# Patient Record
Sex: Male | Born: 1970
Health system: Southern US, Community
[De-identification: ages and names within clinical notes are randomized; demographics above are authoritative.]

## PROBLEM LIST (undated history)

## (undated) DIAGNOSIS — K219 Gastro-esophageal reflux disease without esophagitis: Secondary | ICD-10-CM

## (undated) DIAGNOSIS — K589 Irritable bowel syndrome without diarrhea: Secondary | ICD-10-CM

## (undated) DIAGNOSIS — M25579 Pain in unspecified ankle and joints of unspecified foot: Secondary | ICD-10-CM

## (undated) DIAGNOSIS — G4700 Insomnia, unspecified: Secondary | ICD-10-CM

## (undated) DIAGNOSIS — F319 Bipolar disorder, unspecified: Secondary | ICD-10-CM

## (undated) DIAGNOSIS — M549 Dorsalgia, unspecified: Secondary | ICD-10-CM

## (undated) DIAGNOSIS — F909 Attention-deficit hyperactivity disorder, unspecified type: Secondary | ICD-10-CM

## (undated) DIAGNOSIS — K224 Dyskinesia of esophagus: Secondary | ICD-10-CM

## (undated) DIAGNOSIS — E039 Hypothyroidism, unspecified: Secondary | ICD-10-CM

## (undated) DIAGNOSIS — E7800 Pure hypercholesterolemia, unspecified: Secondary | ICD-10-CM

## (undated) HISTORY — PX: INGUINAL HERNIA REPAIR: SUR1180

## (undated) HISTORY — DX: Attention-deficit hyperactivity disorder, unspecified type: F90.9

## (undated) HISTORY — DX: Insomnia, unspecified: G47.00

## (undated) HISTORY — DX: Pain in unspecified ankle and joints of unspecified foot: M25.579

## (undated) HISTORY — PX: APPENDECTOMY: SHX54

## (undated) HISTORY — DX: Irritable bowel syndrome, unspecified: K58.9

## (undated) HISTORY — PX: VASECTOMY: SHX75

## (undated) HISTORY — DX: Dyskinesia of esophagus: K22.4

## (undated) HISTORY — DX: Dorsalgia, unspecified: M54.9

---

## 2013-02-10 ENCOUNTER — Encounter (HOSPITAL_COMMUNITY): Payer: Self-pay | Admitting: *Deleted

## 2013-02-10 ENCOUNTER — Emergency Department (HOSPITAL_COMMUNITY)
Admission: EM | Admit: 2013-02-10 | Discharge: 2013-02-10 | Disposition: A | Discharge status: Home / Self Care | Payer: BC Managed Care – PPO | Attending: Emergency Medicine | Admitting: Emergency Medicine

## 2013-02-10 DIAGNOSIS — E7800 Pure hypercholesterolemia, unspecified: Secondary | ICD-10-CM | POA: Insufficient documentation

## 2013-02-10 DIAGNOSIS — E039 Hypothyroidism, unspecified: Secondary | ICD-10-CM | POA: Insufficient documentation

## 2013-02-10 DIAGNOSIS — R404 Transient alteration of awareness: Secondary | ICD-10-CM | POA: Insufficient documentation

## 2013-02-10 DIAGNOSIS — R4789 Other speech disturbances: Secondary | ICD-10-CM | POA: Insufficient documentation

## 2013-02-10 DIAGNOSIS — K219 Gastro-esophageal reflux disease without esophagitis: Secondary | ICD-10-CM | POA: Insufficient documentation

## 2013-02-10 DIAGNOSIS — T424X5A Adverse effect of benzodiazepines, initial encounter: Secondary | ICD-10-CM | POA: Insufficient documentation

## 2013-02-10 DIAGNOSIS — F319 Bipolar disorder, unspecified: Secondary | ICD-10-CM | POA: Insufficient documentation

## 2013-02-10 DIAGNOSIS — R471 Dysarthria and anarthria: Secondary | ICD-10-CM | POA: Insufficient documentation

## 2013-02-10 DIAGNOSIS — T43505A Adverse effect of unspecified antipsychotics and neuroleptics, initial encounter: Secondary | ICD-10-CM | POA: Insufficient documentation

## 2013-02-10 DIAGNOSIS — T50905A Adverse effect of unspecified drugs, medicaments and biological substances, initial encounter: Secondary | ICD-10-CM

## 2013-02-10 DIAGNOSIS — Z79899 Other long term (current) drug therapy: Secondary | ICD-10-CM | POA: Insufficient documentation

## 2013-02-10 HISTORY — DX: Gastro-esophageal reflux disease without esophagitis: K21.9

## 2013-02-10 HISTORY — DX: Pure hypercholesterolemia, unspecified: E78.00

## 2013-02-10 HISTORY — DX: Bipolar disorder, unspecified: F31.9

## 2013-02-10 HISTORY — DX: Hypothyroidism, unspecified: E03.9

## 2013-02-10 NOTE — ED Notes (Addendum)
Per EMS - pt c/o confusion and slurred speech.  Pt is a pharmacist at [employer].  Pt states he took Navane 2 mg and 2 mg of Clonazepam at 8 am.  Pt also took Depakote 75 mg and Prilosec at 8 am.  Pt states these are his daily meds and he adjusts them as necessary.  Pt states he didn't get enough sleep last night and when he woke up his speech was slurred.  Pt states this is typical of his speech when he is suffering from insomnia or can't sleep because of mania, which he experienced last night.  Pt also reports flight of ideas.  Pt denies unilateral weakness.  Grips = bilat, smile symmetrical, no arm drift.  Strength = in both upper extremities.  Pt's speech is not slurred in triage, but does sound forced.

## 2013-02-10 NOTE — ED Provider Notes (Signed)
History     CSN: [id number]  Arrival date & time 02/10/13  1223   First MD Initiated Contact with Patient 02/10/13 1302      Chief Complaint  Patient presents with  . slurred speech     took too much of Navane and 2 extra clonazepam    (Consider location/radiation/quality/duration/timing/severity/associated sxs/prior treatment) HPI Comments: Brian Cunningham is a 42 y.o. Male who presents for evaluation of slurred speech. He, states that at 3 AM this morning. He took 1 mg of clonazepam and 2 mg of Navane. He did this to get to sleep. He does not usually take Navane, but uses it, when needed for manic thoughts. His wife had to awaken him for work this morning. While driving to work, he got pulled over and get a ticket for drowsy driving. Later at work, his coworkers, who are pharmacists, told him to go home. At this time, he is improved, but his wife states he is still drowsy. The patient does not feel that his manic thoughts or behaviors are active, now. He sees psychiatric therapist and psychiatric provider, regularly. He has chronic relapsing insomnia, episodes. There are no other known modifying factors.  The history is provided by the patient.    Past Medical History  Diagnosis Date  . Hypothyroidism   . GERD (gastroesophageal reflux disease)   . High cholesterol   . Bipolar disorder     History reviewed. No pertinent past surgical history.  No family history on file.  History  Substance Use Topics  . Smoking status: Not on file  . Smokeless tobacco: Not on file  . Alcohol Use: Not on file      Review of Systems  All other systems reviewed and are negative.    Allergies  Review of patient's allergies indicates no known allergies.  Home Medications   Current Outpatient Rx  Name  Route  Sig  Dispense  Refill  . Cholecalciferol (VITAMIN D) 2000 UNITS tablet   Oral   Take 2,000 Units by mouth daily.         . citalopram (CELEXA) 40 MG tablet   Oral  Take 40 mg by mouth daily as needed.         . clonazePAM (KLONOPIN) 0.5 MG tablet   Oral   Take 0.5 mg by mouth at bedtime as needed (for sleep).         . divalproex (DEPAKOTE ER) 500 MG 24 hr tablet   Oral   Take 1,500 mg by mouth 2 (two) times daily.         Marland Kitchen levothyroxine (SYNTHROID, LEVOTHROID) 75 MCG tablet   Oral   Take 75 mcg by mouth daily before breakfast.         . lurasidone (LATUDA) 40 MG TABS   Oral   Take 40 mg by mouth every evening.         . modafinil (PROVIGIL) 200 MG tablet   Oral   Take 200 mg by mouth daily.         . Multiple Vitamin (MULTIVITAMIN WITH MINERALS) TABS   Oral   Take 1 tablet by mouth daily.         . Omega-3 Fatty Acids (FISH OIL) 1200 MG CPDR   Oral   Take 1,200 mg by mouth.         Marland Kitchen omeprazole (PRILOSEC) 40 MG capsule   Oral   Take 40 mg by mouth daily.         Marland Kitchen  oxybutynin (DITROPAN-XL) 10 MG 24 hr tablet   Oral   Take 10 mg by mouth daily.         . pravastatin (PRAVACHOL) 80 MG tablet   Oral   Take 80 mg by mouth daily.         . Pseudoephedrine HCl (SUDAFED 24 HOUR NON-DROWSY) 240 MG TB24   Oral   Take 240 mg by mouth daily as needed (congestion).         . topiramate (TOPAMAX) 100 MG tablet   Oral   Take 100 mg by mouth 2 (two) times daily.         . traZODone (DESYREL) 100 MG tablet   Oral   Take 100 mg by mouth at bedtime as needed for sleep.         Marland Kitchen venlafaxine (EFFEXOR) 75 MG tablet   Oral   Take 75 mg by mouth every morning.         . ziprasidone (GEODON) 40 MG capsule   Oral   Take 40 mg by mouth 2 (two) times daily with a meal.           BP 107/72  Pulse 73  Temp(Src) 98.3 F (36.8 C) (Oral)  Resp 16  SpO2 97%  Physical Exam  Nursing note and vitals reviewed. Constitutional: He is oriented to person, place, and time. He appears well-developed and well-nourished.  HENT:  Head: Normocephalic and atraumatic.  Right Ear: External ear normal.  Left Ear:  External ear normal.  Eyes: Conjunctivae and EOM are normal. Pupils are equal, round, and reactive to light.  Neck: Normal range of motion and phonation normal. Neck supple.  Cardiovascular: Normal rate, regular rhythm, normal heart sounds and intact distal pulses.   Pulmonary/Chest: Effort normal and breath sounds normal. He exhibits no bony tenderness.  Abdominal: Soft. Normal appearance. There is no tenderness.  Musculoskeletal: Normal range of motion.  Neurological: He is alert and oriented to person, place, and time. He has normal strength. No cranial nerve deficit or sensory deficit. He exhibits normal muscle tone. Coordination normal.  Very mild dysarthria. At times he falls asleep when not being stimulated, by speech.  Skin: Skin is warm, dry and intact.  Psychiatric: He has a normal mood and affect. His behavior is normal. Judgment and thought content normal.    ED Course  Procedures (including critical care time)      1. Medication adverse effect, initial encounter       MDM  Drowsiness related to polypharmacy and unaccustomed to using Navane. Doubt CVA, additional medication overdoses, metabolic instability, or occult infection. He stable for discharge with outpatient followup with his psychiatric provider.  Nursing Notes Reviewed/ Care Coordinated, and agree without changes. Applicable Imaging Reviewed.  Interpretation of Laboratory Data incorporated into ED treatment    Plan: Home Medications- usual; Home Treatments- he is advised to not work or drive within one day of taking a dose of Navane. He was offered psychiatric assessment in the ED, and declined it.; Recommended follow up- he is to followup with his psychiatric provider as soon as possible         Flint Melter, MD 02/10/13 1329

## 2013-02-23 ENCOUNTER — Emergency Department (HOSPITAL_COMMUNITY): Payer: BC Managed Care – PPO

## 2013-02-23 ENCOUNTER — Emergency Department (HOSPITAL_COMMUNITY)
Admission: EM | Admit: 2013-02-23 | Discharge: 2013-02-23 | Disposition: A | Discharge status: Home / Self Care | Payer: BC Managed Care – PPO | Attending: Emergency Medicine | Admitting: Emergency Medicine

## 2013-02-23 ENCOUNTER — Encounter (HOSPITAL_COMMUNITY): Payer: Self-pay | Admitting: Emergency Medicine

## 2013-02-23 DIAGNOSIS — F319 Bipolar disorder, unspecified: Secondary | ICD-10-CM | POA: Insufficient documentation

## 2013-02-23 DIAGNOSIS — Z79899 Other long term (current) drug therapy: Secondary | ICD-10-CM | POA: Insufficient documentation

## 2013-02-23 DIAGNOSIS — E7800 Pure hypercholesterolemia, unspecified: Secondary | ICD-10-CM | POA: Insufficient documentation

## 2013-02-23 DIAGNOSIS — K219 Gastro-esophageal reflux disease without esophagitis: Secondary | ICD-10-CM | POA: Insufficient documentation

## 2013-02-23 DIAGNOSIS — E039 Hypothyroidism, unspecified: Secondary | ICD-10-CM | POA: Insufficient documentation

## 2013-02-23 DIAGNOSIS — R509 Fever, unspecified: Secondary | ICD-10-CM | POA: Insufficient documentation

## 2013-02-23 DIAGNOSIS — R5381 Other malaise: Secondary | ICD-10-CM | POA: Insufficient documentation

## 2013-02-23 DIAGNOSIS — R5383 Other fatigue: Secondary | ICD-10-CM | POA: Insufficient documentation

## 2013-02-23 DIAGNOSIS — R0602 Shortness of breath: Secondary | ICD-10-CM | POA: Insufficient documentation

## 2013-02-23 NOTE — ED Notes (Signed)
Pt states that on Thursday he started felling bad. Then yesterday pt started having fever, shakiness of hands, weakness/fatigue.  Pt states that he went to [facility] and O2 sat was low and was instructed to come to ED for further eval.

## 2013-02-23 NOTE — ED Provider Notes (Signed)
History  This chart was scribed for non-physician practitioner working with Brian Cunningham. Oletta Lamas, MD by Greggory Stallion, ED scribe. This patient was seen in room WTR5/WTR5 and the patient's care was started at 3:08 PM.  CSN: [id number] Arrival date & time 02/23/13  1404   Chief Complaint  Patient presents with  . Fever  . Shortness of Breath  . Fatigue    Patient is a 42 y.o. male presenting with fever. The history is provided by the patient. No language interpreter was used.  Fever Max temp prior to arrival:  101.4 Severity:  Mild Onset quality:  Gradual Duration:  2 days Timing:  Intermittent Chronicity:  New Relieved by:  Ibuprofen Associated symptoms: chills     HPI Comments: Stavros Cail is a 42 y.o. male who presents to the Emergency Department complaining of gradual onset, intermittent fever with associated SOB, chills and fatigue that started 2 days ago. He states his fever was 101.4 yesterday. Pt states two nights ago his hands were shaking uncontrollably. He states that his wife told him his lips were blue. Pt states he went to [facility] and his O2 sat was low, 86%, and was instructed to come to the ED for further evaluation. He states he has taken 1000 mg ibuprofen with some relief. Pt states he feels like all symptoms were relieved for 8 hours by the ibuprofen and then came back. He states he has never had this problem before. He states he has HA due to the fever. Pt states for the past years he has a sensation in his throat like he needs to cough something up but can't. Pt denies CP, abdominal pain, ear pain, nausea, emesis, diarrhea, and numbness as associated symptoms. Pt states he has been taken his medication regularly as prescribed and has not had any new medication. He states his appetite has remained normal.  Past Medical History  Diagnosis Date  . Hypothyroidism   . GERD (gastroesophageal reflux disease)   . High cholesterol   . Bipolar disorder    Past  Surgical History  Procedure Laterality Date  . Appendectomy     No family history on file. History  Substance Use Topics  . Smoking status: Never Smoker   . Smokeless tobacco: Not on file  . Alcohol Use: Yes    Review of Systems  Constitutional: Positive for fever and chills.  All other systems reviewed and are negative.    Allergies  Review of patient's allergies indicates no known allergies.  Home Medications   Current Outpatient Rx  Name  Route  Sig  Dispense  Refill  . Cholecalciferol (VITAMIN D) 2000 UNITS tablet   Oral   Take 2,000 Units by mouth daily.         . clonazePAM (KLONOPIN) 0.5 MG tablet   Oral   Take 0.5 mg by mouth at bedtime as needed (for sleep).         . divalproex (DEPAKOTE ER) 500 MG 24 hr tablet   Oral   Take 1,500 mg by mouth 2 (two) times daily.         Marland Kitchen EPINEPHrine (EPI-PEN) 0.3 mg/0.3 mL SOAJ   Intramuscular   Inject 0.3 mg into the muscle once.         Marland Kitchen levothyroxine (SYNTHROID, LEVOTHROID) 75 MCG tablet   Oral   Take 75 mcg by mouth daily before breakfast.         . lurasidone (LATUDA) 40 MG TABS   Oral  Take 40 mg by mouth every evening.         . modafinil (PROVIGIL) 200 MG tablet   Oral   Take 200 mg by mouth daily.         . Multiple Vitamin (MULTIVITAMIN WITH MINERALS) TABS   Oral   Take 1 tablet by mouth daily.         . Omega-3 Fatty Acids (FISH OIL) 1200 MG CPDR   Oral   Take 1,200 mg by mouth.         Marland Kitchen omeprazole (PRILOSEC) 40 MG capsule   Oral   Take 40 mg by mouth daily.         Marland Kitchen oxybutynin (DITROPAN-XL) 10 MG 24 hr tablet   Oral   Take 10 mg by mouth daily.         . pravastatin (PRAVACHOL) 20 MG tablet   Oral   Take 20 mg by mouth daily.         . Pseudoephedrine HCl (SUDAFED 24 HOUR NON-DROWSY) 240 MG TB24   Oral   Take 240 mg by mouth daily as needed (congestion).         . topiramate (TOPAMAX) 100 MG tablet   Oral   Take 100 mg by mouth at bedtime.           . traZODone (DESYREL) 100 MG tablet   Oral   Take 100 mg by mouth at bedtime as needed for sleep.         Marland Kitchen venlafaxine XR (EFFEXOR-XR) 75 MG 24 hr capsule   Oral   Take 75 mg by mouth daily.          BP 114/75  Temp(Src) 97.7 F (36.5 C) (Oral)  Resp 17  SpO2 99%  Physical Exam  Nursing note and vitals reviewed. Constitutional: He is oriented to person, place, and time. He appears well-developed and well-nourished. No distress.  HENT:  Head: Normocephalic and atraumatic.  Eyes: EOM are normal.  Neck: Normal range of motion. Neck supple. No tracheal deviation present.  Cardiovascular: Normal rate, regular rhythm and normal heart sounds.   Normal cap refill left hand. 3+ cap refill on right hand. Radial pulse normal.   Pulmonary/Chest: Effort normal and breath sounds normal. No respiratory distress.  Musculoskeletal: Normal range of motion. He exhibits no edema.  5/5 strength to all 4 extremities.  Neurological: He is alert and oriented to person, place, and time.  Skin: Skin is warm and dry.  Psychiatric: He has a normal mood and affect. His behavior is normal.    ED Course  Procedures (including critical care time)  DIAGNOSTIC STUDIES: Oxygen Saturation is 99% on RA, normal by my interpretation.    COORDINATION OF CARE: 3:30 PM-Discussed treatment plan which includes chest xray with pt at bedside and pt agreed to plan.  3:50 PM Pt with stable normal vital sign, afebrile, in no acute resp distress, nontoxic in appearance.  CXR shows no acute finding.   R hand with questionable [place] as tip of fingers with mildly delay cap refill however normal radial pulses.  I recommend pt to have close f/u with PCP for further evaluation.  Otherwise pt stable for discharge. Pt agrees with plan and agrees to return if sxs worsen. Ambulate while maintaining normal O2.  Labs Reviewed - No data to display Dg Chest 2 View  02/23/2013   *RADIOLOGY REPORT*  Clinical Data: Central  chest pain.  History of hypertension.  CHEST - 2 VIEW  Comparison: None.  Findings: The heart size and mediastinal contours are normal. The lungs are clear. There is no pleural effusion or pneumothorax. No acute osseous findings are identified.  IMPRESSION: No active cardiopulmonary process.   Original Report Authenticated By: Carey Bullocks, M.D.   1. SOB (shortness of breath)     MDM  BP 114/75  Temp(Src) 97.7 F (36.5 C) (Oral)  Resp 17  SpO2 99%   I have reviewed nursing notes and vital signs. I personally reviewed the imaging tests through PACS system  I reviewed available ER/hospitalization records thought the EMR    I personally performed the services described in this documentation, which was scribed in my presence. The recorded information has been reviewed and is accurate.    Fayrene Helper, PA-C 02/23/13 1557

## 2013-02-24 NOTE — ED Provider Notes (Signed)
Medical screening examination/treatment/procedure(s) were performed by non-physician practitioner and as supervising physician I was immediately available for consultation/collaboration.   Bronda Alfred Y. Olivette Beckmann, MD 02/24/13 0002 

## 2015-04-29 ENCOUNTER — Other Ambulatory Visit: Payer: Self-pay | Admitting: Physician Assistant

## 2015-04-29 ENCOUNTER — Other Ambulatory Visit: Payer: Self-pay

## 2015-04-29 DIAGNOSIS — R079 Chest pain, unspecified: Secondary | ICD-10-CM

## 2015-05-05 ENCOUNTER — Ambulatory Visit
Admission: RE | Admit: 2015-05-05 | Discharge: 2015-05-05 | Disposition: A | Discharge status: Home / Self Care | Payer: BLUE CROSS/BLUE SHIELD | Source: Ambulatory Visit | Attending: Physician Assistant | Admitting: Physician Assistant

## 2015-05-05 DIAGNOSIS — R079 Chest pain, unspecified: Secondary | ICD-10-CM

## 2015-06-24 ENCOUNTER — Other Ambulatory Visit: Payer: Self-pay | Admitting: Physician Assistant

## 2015-06-24 DIAGNOSIS — R933 Abnormal findings on diagnostic imaging of other parts of digestive tract: Secondary | ICD-10-CM

## 2015-06-30 ENCOUNTER — Other Ambulatory Visit: Payer: BLUE CROSS/BLUE SHIELD

## 2015-09-02 ENCOUNTER — Inpatient Hospital Stay: Admission: RE | Admit: 2015-09-02 | Payer: BLUE CROSS/BLUE SHIELD | Source: Ambulatory Visit

## 2015-10-21 ENCOUNTER — Ambulatory Visit
Admission: RE | Admit: 2015-10-21 | Discharge: 2015-10-21 | Disposition: A | Discharge status: Home / Self Care | Payer: Medicaid Other | Source: Ambulatory Visit | Attending: Physician Assistant | Admitting: Physician Assistant

## 2015-10-21 DIAGNOSIS — R933 Abnormal findings on diagnostic imaging of other parts of digestive tract: Secondary | ICD-10-CM

## 2015-10-21 MED ORDER — IOPAMIDOL (ISOVUE-300) INJECTION 61%
75.0000 mL | Freq: Once | INTRAVENOUS | Status: AC | PRN
  Administered 2015-10-21: 75 mL via INTRAVENOUS

## 2015-10-24 NOTE — Progress Notes (Signed)
Patient ID: Brian Cunningham, male   DOB: 1970/12/31, 45 y.o.   MRN: [id number]     Cardiology Office Note   Date:  10/26/2015   ID:  Brian Cunningham, DOB [birth year], MRN [id number]  PCP:  Brian Cunningham  Cardiologist:   Brian Rouge, MD   Chief Complaint  Patient presents with  . Establish Care    Atypical CP      History of Present Illness: Brian Cunningham is a 45 y.o. male who presents for evaluation of chest pain. Noted CT chest normal  10/21/15  Done for abnormal barium swallow.  Saw [place] PA mark Hepler  10/19/15 complained about non exertional chest pain.  Normal barrium swallow and endoscopy.  Persists despite omeprazole for reflux.  Given SL nitro to try  Did help pain once He has had this pain for years.  Recently left stressful job with [employer] and pain better Also saw chiropractor for back pain and chest pain better  Pain can be worse at night in recumbencey  Sometimes can't get comfortable   No family history of premature CAD  Uses the elliptical for 30 minutes most mornings with no pain .      Past Medical History  Diagnosis Date  . Hypothyroidism   . GERD (gastroesophageal reflux disease)   . High cholesterol   . Bipolar disorder [facility])     Past Surgical History  Procedure Laterality Date  . Appendectomy       Current Outpatient Prescriptions  Medication Sig Dispense Refill  . aspirin 162 MG EC tablet Take 162 mg by mouth daily.    . Cholecalciferol (VITAMIN D3) 5000 units TABS Take 1 tablet by mouth daily.    . divalproex (DEPAKOTE ER) 500 MG 24 hr tablet Take 500 mg by mouth 2 (two) times daily.     Marland Kitchen levothyroxine (SYNTHROID, LEVOTHROID) 75 MCG tablet Take 75 mcg by mouth daily before breakfast.    . lurasidone (LATUDA) 40 MG TABS Take 40 mg by mouth every evening.    . Multiple Vitamin (MULTIVITAMIN WITH MINERALS) TABS Take 1 tablet by mouth daily.    . nitroGLYCERIN (NITROSTAT) 0.3 MG SL tablet Place 0.3 mg under the tongue every 5  (five) minutes as needed for chest pain (3 doses max).    . Omega-3 Fatty Acids (FISH OIL) 1200 MG CPDR Take 1,200 mg by mouth.    Marland Kitchen omeprazole (PRILOSEC) 40 MG capsule Take 40 mg by mouth 2 (two) times daily.     Marland Kitchen venlafaxine XR (EFFEXOR-XR) 75 MG 24 hr capsule Take 75 mg by mouth daily.     No current facility-administered medications for this visit.    Allergies:   Review of patient's allergies indicates no known allergies.    Social History:  The patient  reports that he has never smoked. He does not have any smokeless tobacco history on file. He reports that he drinks alcohol.   Family History:  The patient's family history includes Hyperlipidemia in his father.    ROS:  Please see the history of present illness.   Otherwise, review of systems are positive for none.   All other systems are reviewed and negative.    PHYSICAL EXAM: VS:  BP 122/80 mmHg  Pulse 88  Ht 6\' 3"  (1.905 m)  Wt 116.302 kg (256 lb 6.4 oz)  BMI 32.05 kg/m2  SpO2 98% , BMI Body mass index is 32.05 kg/(m^2). Affect appropriate Healthy:  appears stated age 56: normal Neck supple with no  adenopathy JVP normal no bruits no thyromegaly Lungs clear with no wheezing and good diaphragmatic motion Heart:  S1/S2 no murmur, no rub, gallop or click PMI normal Abdomen: benighn, BS positve, no tenderness, no AAA no bruit.  No HSM or HJR Distal pulses intact with no bruits No edema Neuro non-focal Skin warm and dry No muscular weakness    EKG:   02/23/13  SR rate 93 low voltage  10/26/15  SR rate 84 normal    Recent Labs: No results found for requested labs within last 365 days.    Lipid Panel No results found for: CHOL, TRIG, HDL, CHOLHDL, VLDL, LDLCALC, LDLDIRECT    Wt Readings from Last 3 Encounters:  10/26/15 116.302 kg (256 lb 6.4 oz)      Other studies Reviewed: Additional studies/ records that were reviewed today include: [facility] care notes see HPI.    ASSESSMENT AND PLAN:  1.   Chest pain normal ECG atypical f/u ETT Also recommended coronary calcium score for 5 year risk Suspect pain is related to stress 2. Thyroid on replacement TSH normal 3. Depression continue effexor   4. GERD:  Recent EGD and barrium swallow normal on prilosec   Current medicines are reviewed at length with the patient today.  The patient does not have concerns regarding medicines.  The following changes have been made:  no change  Labs/ tests ordered today include: Calcium score and ETT  No orders of the defined types were placed in this encounter.     Disposition:   FU with me PRN     Signed, Brian Rouge, MD  10/26/2015 11:59 AM    [facility] [place], [place], [place]  [address] Phone: [phone]; Fax: [phone]

## 2015-10-26 ENCOUNTER — Encounter: Payer: Self-pay | Admitting: Cardiovascular Disease

## 2015-10-26 ENCOUNTER — Ambulatory Visit (INDEPENDENT_AMBULATORY_CARE_PROVIDER_SITE_OTHER): Payer: Medicaid Other | Admitting: Cardiovascular Disease

## 2015-10-26 VITALS — BP 122/80 | HR 88 | Ht 75.0 in | Wt 256.4 lb

## 2015-10-26 DIAGNOSIS — Z7189 Other specified counseling: Secondary | ICD-10-CM | POA: Diagnosis not present

## 2015-10-26 DIAGNOSIS — Z7689 Persons encountering health services in other specified circumstances: Secondary | ICD-10-CM

## 2015-10-26 DIAGNOSIS — R079 Chest pain, unspecified: Secondary | ICD-10-CM

## 2015-10-26 NOTE — Patient Instructions (Signed)
Medication Instructions:  Your physician recommends that you continue on your current medications as directed. Please refer to the Current Medication list given to you today.  Labwork: NONE  Testing/Procedures: Cardiac CT Calcium score scanning, (CAT scanning), is a noninvasive, special x-ray that produces cross-sectional images of the body using x-rays and a computer. CT scans help physicians diagnose and treat medical conditions. For some CT exams, a contrast material is used to enhance visibility in the area of the body being studied. CT scans provide greater clarity and reveal more details than regular x-ray exams.  Your physician has requested that you have an exercise tolerance test. For further information please visit [email or web address]. Please also follow instruction sheet, as given.  Follow-Up: Your physician wants you to follow-up as needed with Dr. Johnsie Cancel.   If you need a refill on your cardiac medications before your next appointment, please call your pharmacy.

## 2015-11-05 ENCOUNTER — Ambulatory Visit (INDEPENDENT_AMBULATORY_CARE_PROVIDER_SITE_OTHER)
Admission: RE | Admit: 2015-11-05 | Discharge: 2015-11-05 | Disposition: A | Discharge status: Home / Self Care | Payer: Self-pay | Source: Ambulatory Visit | Attending: Cardiovascular Disease | Admitting: Cardiovascular Disease

## 2015-11-05 ENCOUNTER — Ambulatory Visit (INDEPENDENT_AMBULATORY_CARE_PROVIDER_SITE_OTHER): Payer: Medicaid Other

## 2015-11-05 DIAGNOSIS — R079 Chest pain, unspecified: Secondary | ICD-10-CM | POA: Diagnosis not present

## 2015-11-05 LAB — EXERCISE TOLERANCE TEST
CHL CUP MPHR: 176 {beats}/min
CHL CUP STRESS STAGE 1 DBP: 89 mmHg
CHL CUP STRESS STAGE 1 GRADE: 0 %
CHL CUP STRESS STAGE 1 HR: 86 {beats}/min
CHL CUP STRESS STAGE 1 SBP: 143 mmHg
CHL CUP STRESS STAGE 1 SPEED: 0 mph
CHL CUP STRESS STAGE 2 GRADE: 0 %
CHL CUP STRESS STAGE 2 HR: 87 {beats}/min
CHL CUP STRESS STAGE 3 GRADE: 0 %
CHL CUP STRESS STAGE 4 GRADE: 10 %
CHL CUP STRESS STAGE 4 HR: 100 {beats}/min
CHL CUP STRESS STAGE 4 SBP: 186 mmHg
CHL CUP STRESS STAGE 4 SPEED: 1.7 mph
CHL CUP STRESS STAGE 5 DBP: 81 mmHg
CHL CUP STRESS STAGE 5 GRADE: 12 %
CHL CUP STRESS STAGE 5 HR: 115 {beats}/min
CHL CUP STRESS STAGE 5 SBP: 166 mmHg
CHL CUP STRESS STAGE 5 SPEED: 2.5 mph
CHL CUP STRESS STAGE 6 GRADE: 14 %
CHL CUP STRESS STAGE 6 SPEED: 3.4 mph
CHL CUP STRESS STAGE 7 GRADE: 16 %
CHL CUP STRESS STAGE 7 HR: 139 {beats}/min
CHL CUP STRESS STAGE 8 HR: 130 {beats}/min
CHL CUP STRESS STAGE 8 SBP: 182 mmHg
CHL CUP STRESS STAGE 8 SPEED: 1.5 mph
CHL CUP STRESS STAGE 9 SBP: 154 mmHg
CSEPEW: 11.5 METS
CSEPHR: 78 %
CSEPPHR: 139 {beats}/min
CSEPPMHR: 78 %
Exercise duration (min): 9 min
Exercise duration (sec): 52 s
RPE: 17
Rest HR: 81 {beats}/min
Stage 2 Speed: 1 mph
Stage 3 HR: 86 {beats}/min
Stage 3 Speed: 1 mph
Stage 4 DBP: 86 mmHg
Stage 6 DBP: 88 mmHg
Stage 6 HR: 133 {beats}/min
Stage 6 SBP: 194 mmHg
Stage 7 Speed: 4.2 mph
Stage 8 DBP: 85 mmHg
Stage 8 Grade: 0 %
Stage 9 DBP: 92 mmHg
Stage 9 Grade: 0 %
Stage 9 HR: 99 {beats}/min
Stage 9 Speed: 0 mph

## 2015-11-12 ENCOUNTER — Telehealth: Payer: Self-pay | Admitting: Cardiovascular Disease

## 2015-11-12 NOTE — Telephone Encounter (Signed)
Reviewed results of cardiac CT score and GXT with patient who verbalized understanding and agreement to follow-up as needed.

## 2015-11-12 NOTE — Telephone Encounter (Signed)
New message ° ° ° ° ° °Returning a nurses call to get test results °

## 2017-12-11 MED FILL — LEVOTHYROXINE 88 MCG TABLET: 88 | 30 days supply | Qty: 30 | Fill #0

## 2018-01-12 MED FILL — PRAVASTATIN SODIUM 20 MG TA: 20 | 90 days supply | Qty: 90 | Fill #0

## 2018-01-12 MED FILL — LEVOTHYROXINE 88 MCG TABLET: 88 | 30 days supply | Qty: 30 | Fill #1

## 2018-01-17 DIAGNOSIS — E7800 Pure hypercholesterolemia, unspecified: Secondary | ICD-10-CM | POA: Diagnosis not present

## 2018-01-17 DIAGNOSIS — Z7689 Persons encountering health services in other specified circumstances: Secondary | ICD-10-CM | POA: Diagnosis not present

## 2018-01-17 DIAGNOSIS — N529 Male erectile dysfunction, unspecified: Secondary | ICD-10-CM | POA: Diagnosis not present

## 2018-01-17 DIAGNOSIS — E039 Hypothyroidism, unspecified: Secondary | ICD-10-CM | POA: Diagnosis not present

## 2018-01-17 MED FILL — SILDENAFIL 25 MG TABLET: 25 | 30 days supply | Qty: 6 | Fill #0

## 2018-01-18 MED FILL — traZODone HCL 100 MG TABS: 100 | 90 days supply | Qty: 90 | Fill #0

## 2018-01-29 DIAGNOSIS — F3132 Bipolar disorder, current episode depressed, moderate: Secondary | ICD-10-CM | POA: Diagnosis not present

## 2018-01-29 DIAGNOSIS — G4700 Insomnia, unspecified: Secondary | ICD-10-CM | POA: Diagnosis not present

## 2018-01-29 MED FILL — ADDERALL XR 10 MG CAP SA: 10 | 30 days supply | Qty: 30 | Fill #0

## 2018-02-14 MED FILL — LEVOTHYROXINE 88 MCG TABLET: 88 | 30 days supply | Qty: 30 | Fill #2

## 2018-02-22 MED FILL — LATUDA 40 MG TABLET: 40 | 30 days supply | Qty: 30 | Fill #0

## 2018-02-28 DIAGNOSIS — F3132 Bipolar disorder, current episode depressed, moderate: Secondary | ICD-10-CM | POA: Diagnosis not present

## 2018-02-28 DIAGNOSIS — G4700 Insomnia, unspecified: Secondary | ICD-10-CM | POA: Diagnosis not present

## 2018-02-28 MED FILL — ADDERALL XR 20 MG CAP SA: 20 | 30 days supply | Qty: 30 | Fill #0

## 2018-03-12 MED FILL — DIVALPROEX SOD ER 500 MG TA: 500 | 30 days supply | Qty: 60 | Fill #0

## 2018-03-26 MED FILL — LEVOTHYROXINE 88 MCG TABLET: 88 | 30 days supply | Qty: 30 | Fill #3

## 2018-04-09 MED FILL — VENLAFAXINE HCL ER 75 MG CA: 75 | 30 days supply | Qty: 30 | Fill #0

## 2018-04-09 MED FILL — ADDERALL XR 20 MG CAP SA: 20 | 30 days supply | Qty: 30 | Fill #0

## 2018-04-09 MED FILL — traZODone HCL 100 MG TABS: 100 | 30 days supply | Qty: 30 | Fill #0

## 2018-04-26 MED FILL — LEVOTHYROXINE 88 MCG TABLET: 88 | 30 days supply | Qty: 30 | Fill #4

## 2018-04-26 MED FILL — DIVALPROEX SOD ER 500 MG TA: 500 | 30 days supply | Qty: 60 | Fill #1

## 2018-04-26 MED FILL — PRAVASTATIN SODIUM 20 MG TA: 20 | 60 days supply | Qty: 60 | Fill #1

## 2018-05-09 MED FILL — VENLAFAXINE HCL ER 75 MG CA: 75 | 30 days supply | Qty: 30 | Fill #1

## 2018-05-24 MED FILL — DIVALPROEX SOD ER 500 MG TA: 500 | 30 days supply | Qty: 60 | Fill #2

## 2018-05-24 MED FILL — LEVOTHYROXINE 88 MCG TABLET: 88 | 30 days supply | Qty: 30 | Fill #5

## 2018-06-06 MED FILL — traZODone HCL 100 MG TABS: 100 | 30 days supply | Qty: 30 | Fill #0

## 2018-06-06 MED FILL — ADDERALL XR 20 MG CAP SA: 20 | 30 days supply | Qty: 30 | Fill #0

## 2018-06-08 MED FILL — ZIPRASIDONE HCL 40 MG CAP: 40 | 30 days supply | Qty: 30 | Fill #0

## 2018-06-25 MED FILL — VENLAFAXINE HCL ER 75 MG CA: 75 | 30 days supply | Qty: 30 | Fill #2

## 2018-07-02 DIAGNOSIS — F3132 Bipolar disorder, current episode depressed, moderate: Secondary | ICD-10-CM | POA: Diagnosis not present

## 2018-07-02 DIAGNOSIS — G4700 Insomnia, unspecified: Secondary | ICD-10-CM | POA: Diagnosis not present

## 2018-07-02 MED FILL — DIVALPROEX SOD ER 500 MG TA: 500 | 90 days supply | Qty: 180 | Fill #0

## 2018-07-03 DIAGNOSIS — M542 Cervicalgia: Secondary | ICD-10-CM | POA: Diagnosis not present

## 2018-07-03 DIAGNOSIS — E032 Hypothyroidism due to medicaments and other exogenous substances: Secondary | ICD-10-CM | POA: Diagnosis not present

## 2018-07-03 DIAGNOSIS — M549 Dorsalgia, unspecified: Secondary | ICD-10-CM | POA: Diagnosis not present

## 2018-07-03 DIAGNOSIS — E7800 Pure hypercholesterolemia, unspecified: Secondary | ICD-10-CM | POA: Diagnosis not present

## 2018-07-03 MED FILL — CYCLOBENZAPRINE 10 MG TAB: 10 | 7 days supply | Qty: 21 | Fill #0

## 2018-07-03 MED FILL — PRAVASTATIN SODIUM 20 MG TA: 20 | 30 days supply | Qty: 30 | Fill #0

## 2018-07-03 MED FILL — NAPROXEN DR 500 MG TABLET: 500 | 14 days supply | Qty: 28 | Fill #0

## 2018-07-03 MED FILL — SILDENAFIL CITRATE 50 MG TA: 50 | 30 days supply | Qty: 6 | Fill #0

## 2018-07-03 MED FILL — LEVOTHYROXINE 88 MCG TABLET: 88 | 30 days supply | Qty: 30 | Fill #0

## 2018-07-27 MED FILL — PRAVASTATIN SODIUM 20 MG TA: 20 | 30 days supply | Qty: 30 | Fill #1

## 2018-07-27 MED FILL — traZODone HCL 100 MG TABS: 100 | 30 days supply | Qty: 30 | Fill #1

## 2018-07-27 MED FILL — LEVOTHYROXINE 88 MCG TABLET: 88 | 30 days supply | Qty: 30 | Fill #1

## 2018-07-27 MED FILL — VENLAFAXINE HCL ER 75 MG CA: 75 | 90 days supply | Qty: 90 | Fill #0

## 2018-07-31 MED FILL — PRAVASTATIN NA 40 MG TAB: 40 | 90 days supply | Qty: 90 | Fill #0

## 2018-08-08 MED FILL — SILDENAFIL CITRATE 50 MG TA: 50 | 30 days supply | Qty: 6 | Fill #1

## 2018-08-30 MED FILL — traZODone HCL 100 MG TABS: 100 | 30 days supply | Qty: 30 | Fill #2

## 2018-08-30 MED FILL — PRAVASTATIN SODIUM 20 MG TA: 20 | 30 days supply | Qty: 30 | Fill #2

## 2018-08-30 MED FILL — ADDERALL XR 20 MG CAP SA: 20 | 30 days supply | Qty: 30 | Fill #0

## 2018-08-30 MED FILL — LEVOTHYROXINE 88 MCG TABLET: 88 | 30 days supply | Qty: 30 | Fill #2

## 2018-09-25 MED FILL — SILDENAFIL CITRATE 50 MG TA: 50 | 30 days supply | Qty: 6 | Fill #2

## 2018-10-01 MED FILL — traZODone HCL 100 MG TABS: 100 | 30 days supply | Qty: 30 | Fill #1

## 2018-10-01 MED FILL — LEVOTHYROXINE 88 MCG TABLET: 88 | 30 days supply | Qty: 30 | Fill #0

## 2018-10-01 MED FILL — ZIPRASIDONE HCL 40 MG CAPS: 40 | 30 days supply | Qty: 30 | Fill #1 | Status: TO

## 2018-10-03 DIAGNOSIS — F3132 Bipolar disorder, current episode depressed, moderate: Secondary | ICD-10-CM | POA: Diagnosis not present

## 2018-10-03 DIAGNOSIS — G4700 Insomnia, unspecified: Secondary | ICD-10-CM | POA: Diagnosis not present

## 2018-10-03 MED FILL — DIVALPROEX SOD ER 500 MG TA: 500 | 90 days supply | Qty: 180 | Fill #0

## 2018-10-03 MED FILL — LATUDA 40 MG TABLET: 40 | 90 days supply | Qty: 90 | Fill #0

## 2018-10-03 MED FILL — ADDERALL XR 20 MG CAP SA: 20 | 30 days supply | Qty: 30 | Fill #0

## 2018-10-23 MED FILL — VENLAFAXINE HCL ER 75 MG CA: 75 | 90 days supply | Qty: 90 | Fill #0

## 2018-11-09 MED FILL — traZODone HCL 100 MG TABS: 100 | 30 days supply | Qty: 30 | Fill #2

## 2018-11-09 MED FILL — LEVOTHYROXINE 88 MCG TABLET: 88 | 30 days supply | Qty: 30 | Fill #1 | Status: TO

## 2018-11-09 MED FILL — SILDENAFIL CITRATE 50 MG TA: 50 | 30 days supply | Qty: 6 | Fill #3 | Status: TO

## 2018-12-03 MED FILL — traZODone HCL 100 MG TABS: 100 | 90 days supply | Qty: 90 | Fill #0

## 2018-12-03 MED FILL — ZIPRASIDONE HCL 40 MG CAPS: 40 | 30 days supply | Qty: 30 | Fill #0

## 2018-12-03 MED FILL — LEVOTHYROXINE 88 MCG TABLET: 88 | 30 days supply | Qty: 30 | Fill #0

## 2018-12-14 MED FILL — LATUDA 40 MG TABLET: 40 | 90 days supply | Qty: 90 | Fill #0

## 2018-12-26 MED FILL — DIVALPROEX SOD ER 500 MG TA: 500 | 90 days supply | Qty: 180 | Fill #0

## 2019-01-11 MED FILL — SILDENAFIL CITRATE 50 MG TA: 50 | 30 days supply | Qty: 6 | Fill #0

## 2019-01-11 MED FILL — LEVOTHYROXINE 88 MCG TABLET: 88 | 30 days supply | Qty: 30 | Fill #0

## 2019-01-22 MED FILL — VENLAFAXINE HCL ER 75 MG CA: 75 | 8 days supply | Qty: 8 | Fill #0

## 2019-01-31 DIAGNOSIS — F3132 Bipolar disorder, current episode depressed, moderate: Secondary | ICD-10-CM | POA: Diagnosis not present

## 2019-01-31 DIAGNOSIS — G4700 Insomnia, unspecified: Secondary | ICD-10-CM | POA: Diagnosis not present

## 2019-01-31 MED FILL — VENLAFAXINE HCL ER 75 MG CA: 75 | 90 days supply | Qty: 90 | Fill #0

## 2019-01-31 MED FILL — MIRTAZAPINE 15 MG TABLET: 15 | 7 days supply | Qty: 7 | Fill #0

## 2019-01-31 MED FILL — ADDERALL XR 20 MG CAP SA: 20 | 30 days supply | Qty: 30 | Fill #0

## 2019-02-19 DIAGNOSIS — Z1159 Encounter for screening for other viral diseases: Secondary | ICD-10-CM | POA: Diagnosis not present

## 2019-02-19 DIAGNOSIS — Z114 Encounter for screening for human immunodeficiency virus [HIV]: Secondary | ICD-10-CM | POA: Diagnosis not present

## 2019-02-19 DIAGNOSIS — E7800 Pure hypercholesterolemia, unspecified: Secondary | ICD-10-CM | POA: Diagnosis not present

## 2019-02-19 DIAGNOSIS — Z125 Encounter for screening for malignant neoplasm of prostate: Secondary | ICD-10-CM | POA: Diagnosis not present

## 2019-02-19 DIAGNOSIS — E032 Hypothyroidism due to medicaments and other exogenous substances: Secondary | ICD-10-CM | POA: Diagnosis not present

## 2019-02-19 DIAGNOSIS — K219 Gastro-esophageal reflux disease without esophagitis: Secondary | ICD-10-CM | POA: Diagnosis not present

## 2019-02-19 DIAGNOSIS — Z0000 Encounter for general adult medical examination without abnormal findings: Secondary | ICD-10-CM | POA: Diagnosis not present

## 2019-02-19 MED FILL — ATORVASTATIN 40 MG TABLET: 40 | 30 days supply | Qty: 30 | Fill #0

## 2019-02-19 MED FILL — OMEPRAZOLE DR 40 MG CAPSULE: 40 | 90 days supply | Qty: 90 | Fill #0

## 2019-02-19 MED FILL — SILDENAFIL CITRATE 50 MG TA: 50 | 50 days supply | Qty: 10 | Fill #0

## 2019-02-25 MED FILL — LEVOTHYROXINE 88 MCG TABLET: 88 | 90 days supply | Qty: 90 | Fill #0

## 2019-02-26 MED FILL — MIRTAZAPINE 15 MG TABLET: 15 | 30 days supply | Qty: 30 | Fill #0

## 2019-03-12 ENCOUNTER — Other Ambulatory Visit: Payer: Self-pay

## 2019-03-12 ENCOUNTER — Encounter (INDEPENDENT_AMBULATORY_CARE_PROVIDER_SITE_OTHER): Payer: Self-pay | Admitting: Family Medicine

## 2019-03-12 ENCOUNTER — Ambulatory Visit (INDEPENDENT_AMBULATORY_CARE_PROVIDER_SITE_OTHER): Payer: 59 | Admitting: Family Medicine

## 2019-03-12 VITALS — BP 122/84 | HR 78 | Temp 97.7°F | Ht 74.0 in | Wt 262.0 lb

## 2019-03-12 DIAGNOSIS — Z6833 Body mass index (BMI) 33.0-33.9, adult: Secondary | ICD-10-CM

## 2019-03-12 DIAGNOSIS — R0602 Shortness of breath: Secondary | ICD-10-CM

## 2019-03-12 DIAGNOSIS — E038 Other specified hypothyroidism: Secondary | ICD-10-CM

## 2019-03-12 DIAGNOSIS — E669 Obesity, unspecified: Secondary | ICD-10-CM | POA: Diagnosis not present

## 2019-03-12 DIAGNOSIS — Z1331 Encounter for screening for depression: Secondary | ICD-10-CM

## 2019-03-12 DIAGNOSIS — F319 Bipolar disorder, unspecified: Secondary | ICD-10-CM

## 2019-03-12 DIAGNOSIS — Z9189 Other specified personal risk factors, not elsewhere classified: Secondary | ICD-10-CM

## 2019-03-12 DIAGNOSIS — R5383 Other fatigue: Secondary | ICD-10-CM

## 2019-03-12 DIAGNOSIS — Z0289 Encounter for other administrative examinations: Secondary | ICD-10-CM

## 2019-03-12 DIAGNOSIS — E7849 Other hyperlipidemia: Secondary | ICD-10-CM | POA: Diagnosis not present

## 2019-03-12 NOTE — Progress Notes (Signed)
.  Office: [phone]  /  Fax: [phone]   HPI:   Chief Complaint: OBESITY  Brian Cunningham (MR# [id number]) is a 48 y.o. male who presents on 03/12/2019 for obesity evaluation and treatment. Current BMI is Body mass index is 33.64 kg/m.Marland Kitchen Brian Cunningham has struggled with obesity for years and has been unsuccessful in either losing weight or maintaining long term weight loss. Brian Cunningham is on multiple psych medications that contribute to weight gain. Brian Cunningham attended our information session and states he is currently in the action stage of change and ready to dedicate time achieving and maintaining a healthier weight.  Brian Cunningham states his family eats meals together he thinks his family will eat healthier with him his desired weight loss is 62 lbs. he started gaining weight around 35 yrs of age his heaviest weight ever was 260 lbs. he has significant food cravings issues  he is frequently drinking liquids with calories he frequently makes poor food choices he has problems with excessive hunger  he frequently eats larger portions than normal  he has binge eating behaviors he struggles with emotional eating    Brian Cunningham feels his energy is lower than it should be. This has worsened with weight gain and has not worsened recently. Brian Cunningham admits to daytime somnolence and he denies waking up still tired. Patient is at risk for obstructive sleep apnea. Patent has a history of symptoms of daytime Brian. Patient generally gets 7 to 9 hours of sleep per night, and states they generally have restful sleep. Snoring is present. Apneic episodes are present. Epworth Sleepiness Score is 6  Dyspnea on exertion Brian Cunningham notes increasing shortness of breath with exercising and seems to be worsening over time with weight gain. He notes getting out of breath sooner with activity than he used to. This has not gotten worse recently. Brian Cunningham denies orthopnea.  Bipolar I  Brian Cunningham is on multiple medications, including some for attention deficit disorder, and he is followed by his psychiatrist. He has had to take atypical antipsychotic medications for more than ten years, which often contributes to weight gain.  Hypothyroidism Brian Cunningham has a diagnosis of hypothyroidism. He is on levothyroxine 88 mcg daily. There are no recent labs in Epic. Brian Cunningham denies palpitations or tremors, but he does admit to ongoing Brian.  At risk for cardiovascular disease Brian Cunningham is at a higher than average risk for cardiovascular disease due to obesity. He currently denies any chest pain.  Depression Screen Brian Cunningham's Food and Mood (modified PHQ-9) score was  Depression screen PHQ 2/9 03/12/2019  Decreased Interest 1  Down, Depressed, Hopeless 1  PHQ - 2 Score 2  Altered sleeping 1  Tired, decreased energy 1  Change in appetite 2  Feeling bad or failure about yourself  1  Trouble concentrating 1  Moving slowly or fidgety/restless 0  Suicidal thoughts 0  PHQ-9 Score 8  Difficult doing work/chores Not difficult at all    ASSESSMENT AND PLAN:  Other Brian - Plan: EKG 12-Lead, Vitamin B12, CBC With Differential, Comprehensive metabolic panel, Folate, Hemoglobin A1c, Insulin, random, VITAMIN D 25 Hydroxy (Vit-D Deficiency, Fractures)  Shortness of breath on exertion  Bipolar 1 disorder (HCC)  Other specified hypothyroidism - Plan: T3, T4, free, TSH  Other hyperlipidemia - Plan: Lipid Panel With LDL/HDL Ratio  Depression screening  At risk for heart disease  Class 1 obesity with serious comorbidity and body mass index (BMI) of 33.0 to 33.9 in adult, unspecified obesity type  PLAN:  Brian Brian Cunningham was informed  that his Brian may be related to obesity, depression or many other causes. Labs will be ordered, and in the meanwhile Brian Cunningham has agreed to work on diet, exercise and weight loss to help with Brian. Proper sleep hygiene was  discussed including the need for 7-8 hours of quality sleep each night. A sleep study was not ordered based on symptoms and Epworth score.  Dyspnea on exertion Brian Cunningham's shortness of breath appears to be obesity related and exercise induced. He has agreed to work on weight loss and gradually increase exercise to treat his exercise induced shortness of breath. If Brian Cunningham follows our instructions and loses weight without improvement of his shortness of breath, we will plan to refer to pulmonology. We will monitor this condition regularly. Brian Cunningham agrees to this plan.  Bipolar I Brian Cunningham will work on weight loss and will monitor for signs of early mania. No change is needed in medications, as he has been stable for years on this regimen.  Hypothyroidism Brian Cunningham was informed of the importance of good thyroid control to help with weight loss efforts. He was also informed that supertherapeutic thyroid levels are dangerous and will not improve weight loss results. We will check labs and follow. Brian Cunningham will continue Synthroid and follow up with our clinic at the agreed upon time.  Hyperlipidemia Brian Cunningham was informed of the American Heart Association Guidelines emphasizing intensive lifestyle modifications as the first line treatment for hyperlipidemia. We discussed many lifestyle modifications today in depth, and Brian Cunningham will start diet and begin to work on decreasing saturated fats such as fatty red meat, butter and many fried foods. He will also increase vegetables and lean protein in his diet and start to work on exercise and weight loss efforts. We will check labs and follow.  Cardiovascular risk counseling Brian Cunningham was given extended (30 minutes) coronary artery disease prevention counseling today. He is 48 y.o. male and has risk factors for heart disease including obesity. We discussed intensive lifestyle modifications today with an emphasis on specific weight loss  instructions and strategies. Pt was also informed of the importance of increasing exercise and decreasing saturated fats to help prevent heart disease.  Depression Screen Brian Cunningham had a mildly positive depression screening. Depression is commonly associated with obesity and often results in emotional eating behaviors. We will monitor this closely and work on CBT to help improve the non-hunger eating patterns. Referral to Psychology may be required if no improvement is seen as he continues in our clinic.  Obesity Brian Cunningham is currently in the action stage of change and his goal is to continue with weight loss efforts He has agreed to follow the Category 4 plan Brian Cunningham has been instructed to work up to a goal of 150 minutes of combined cardio and strengthening exercise per week for weight loss and overall health benefits. We discussed the following Behavioral Modification Strategies today: increasing lean protein intake and decreasing simple carbohydrates   Brian Cunningham has agreed to follow up with our clinic in 2 weeks. He was informed of the importance of frequent follow up visits to maximize his success with intensive lifestyle modifications for his multiple health conditions. He was informed we would discuss his lab results at his next visit unless there is a critical issue that needs to be addressed sooner. Brian Cunningham agreed to keep his next visit at the agreed upon time to discuss these results.  ALLERGIES: No Known Allergies  MEDICATIONS: Current Outpatient Medications on File Prior to Visit  Medication Sig Dispense Refill  .  amphetamine-dextroamphetamine (ADDERALL XR) 20 MG 24 hr capsule Take 20 mg by mouth daily as needed.    . Ascorbic Acid (VITAMIN C) 1000 MG tablet Take 1,000 mg by mouth daily.    Marland Kitchen aspirin EC 81 MG tablet Take 81 mg by mouth daily.    Marland Kitchen atorvastatin (LIPITOR) 40 MG tablet Take 40 mg by mouth daily.    . diphenhydrAMINE (BENADRYL) 25 MG tablet Take 75 mg by  mouth at bedtime.    . divalproex (DEPAKOTE ER) 500 MG 24 hr tablet Take 500 mg by mouth 2 (two) times daily.     Marland Kitchen glucosamine-chondroitin 500-400 MG tablet Take 3 tablets by mouth daily.    Marland Kitchen ibuprofen (ADVIL) 200 MG tablet Take 600 mg by mouth every 6 (six) hours as needed.    Marland Kitchen levothyroxine (SYNTHROID) 88 MCG tablet Take 88 mcg by mouth daily before breakfast.    . loperamide (IMODIUM) 2 MG capsule Take 12 mg by mouth as needed for diarrhea or loose stools.    Marland Kitchen lurasidone (LATUDA) 40 MG TABS Take 40 mg by mouth every evening.    . mirtazapine (REMERON) 15 MG tablet Take 15 mg by mouth at bedtime.    . Multiple Vitamin (MULTIVITAMIN WITH MINERALS) TABS Take 1 tablet by mouth daily.    . Omega-3 Fatty Acids (FISH OIL) 1200 MG CPDR Take 1,200 mg by mouth.    Marland Kitchen omeprazole (PRILOSEC) 40 MG capsule Take 40 mg by mouth 2 (two) times daily.     . sildenafil (VIAGRA) 50 MG tablet Take 50 mg by mouth as needed for erectile dysfunction.    Marland Kitchen venlafaxine XR (EFFEXOR-XR) 75 MG 24 hr capsule Take 75 mg by mouth daily.     No current facility-administered medications on file prior to visit.     PAST MEDICAL HISTORY: Past Medical History:  Diagnosis Date  . ADHD   . Ankle pain   . Back pain   . Bipolar disorder ([place])   . Esophageal spasm   . GERD (gastroesophageal reflux disease)   . High cholesterol   . Hypothyroidism   . IBS (irritable bowel syndrome)   . Insomnia     PAST SURGICAL HISTORY: Past Surgical History:  Procedure Laterality Date  . APPENDECTOMY    . INGUINAL HERNIA REPAIR    . VASECTOMY      SOCIAL HISTORY: Social History   Tobacco Use  . Smoking status: Current Some Day Smoker    Types: Cigars  . Smokeless tobacco: Never Used  . Tobacco comment: One cigar every 2 months or so  Substance Use Topics  . Alcohol use: Yes  . Drug use: Not on file    FAMILY HISTORY: Family History  Problem Relation Age of Onset  . Thyroid disease Mother   . Cancer Mother   .  Depression Mother   . Obesity Mother   . Hyperlipidemia Father   . Stroke Father     ROS: Review of Systems  Constitutional: Positive for malaise/Brian.  HENT:       + Dry Mouth  Eyes:       + Wear Glasses or Contacts  Respiratory: Positive for shortness of breath (on exertion).   Cardiovascular: Negative for chest pain, palpitations and orthopnea.  Gastrointestinal: Positive for diarrhea and heartburn.  Musculoskeletal: Positive for back pain.       + Muscle Stiffness  Neurological: Negative for tremors.  Psychiatric/Behavioral: The patient has insomnia.     PHYSICAL EXAM: Blood  pressure 122/84, pulse 78, temperature 97.7 F (36.5 C), temperature source Oral, height 6\' 2"  (1.88 m), weight 262 lb (118.8 kg), SpO2 96 %. Body mass index is 33.64 kg/m. Physical Exam Vitals signs reviewed.  Constitutional:      Appearance: He is well-developed.  HENT:     Head: Normocephalic and atraumatic.     Nose: Nose normal.  Eyes:     Extraocular Movements: Extraocular movements intact.     Right eye: Normal extraocular motion.     Left eye: Normal extraocular motion.  Neck:     Musculoskeletal: Normal range of motion and neck supple.     Thyroid: No thyromegaly.  Cardiovascular:     Rate and Rhythm: Normal rate and regular rhythm.  Pulmonary:     Effort: Pulmonary effort is normal. No respiratory distress.  Abdominal:     Palpations: Abdomen is soft.     Tenderness: There is no abdominal tenderness.  Musculoskeletal: Normal range of motion.     Comments: Range of Motion normal in all 4 extremities  Skin:    General: Skin is warm and dry.  Neurological:     Mental Status: He is alert and oriented to person, place, and time.  Psychiatric:        Behavior: Behavior normal.     RECENT LABS AND TESTS: BMET No results found for: NA, K, CL, CO2, GLUCOSE, BUN, CREATININE, CALCIUM, GFRNONAA, GFRAA No results found for: HGBA1C No results found for: INSULIN CBC No results  found for: WBC, RBC, HGB, HCT, PLT, MCV, MCH, MCHC, RDW, LYMPHSABS, MONOABS, EOSABS, BASOSABS Iron/TIBC/Ferritin/ %Sat No results found for: IRON, TIBC, FERRITIN, IRONPCTSAT Lipid Panel  No results found for: CHOL, TRIG, HDL, CHOLHDL, VLDL, LDLCALC, LDLDIRECT Hepatic Function Panel  No results found for: PROT, ALBUMIN, AST, ALT, ALKPHOS, BILITOT, BILIDIR, IBILI No results found for: TSH Vitamin D There are no recent lab results  ECG  shows NSR with a rate of 85 BPM INDIRECT CALORIMETER done today shows a VO2 of 350 and a REE of 2438. His calculated basal metabolic rate is 5809 thus his basal metabolic rate is worse than expected.       OBESITY BEHAVIORAL INTERVENTION VISIT  Today's visit was # 1   Starting weight: 262 lbs Starting date: 03/12/2019 Today's weight : 262 lbs Today's date: 03/12/2019 Total lbs lost to date: 0    03/12/2019  Height 6\' 2"  (1.88 m)  Weight 262 lb (118.8 kg)  BMI (Calculated) 33.62  BLOOD PRESSURE - SYSTOLIC 983  BLOOD PRESSURE - DIASTOLIC 84  Waist Measurement  44 inches   Body Fat % 31.3 %  Total Body Water (lbs) 121 lbs  RMR 2438    ASK: We discussed the diagnosis of obesity with Brian Cunningham today and Brian Cunningham agreed to give [name] permission to discuss obesity behavioral modification therapy today.  ASSESS: Milik has the diagnosis of obesity and his BMI today is 33.62 Brian Cunningham is in the action stage of change   ADVISE: Juvenal was educated on the multiple health risks of obesity as well as the benefit of weight loss to improve his health. He was advised of the need for long term treatment and the importance of lifestyle modifications to improve his current health and to decrease his risk of future health problems.  AGREE: Multiple dietary modification options and treatment options were discussed and  Brian Cunningham agreed to follow the recommendations documented in the above note.  ARRANGE: Ravi was educated on  the importance  of frequent visits to treat obesity as outlined per CMS and USPSTF guidelines and agreed to schedule his next follow up appointment today.   I, Doreene Nest, am acting as transcriptionist for Dennard Nip, MD  I have reviewed the above documentation for accuracy and completeness, and I agree with the above. -Dennard Nip, MD

## 2019-03-13 LAB — CBC WITH DIFFERENTIAL
Basophils Absolute: 0 10*3/uL (ref 0.0–0.2)
Basos: 1 %
EOS (ABSOLUTE): 0.2 10*3/uL (ref 0.0–0.4)
Eos: 3 %
Hematocrit: 50.1 % (ref 37.5–51.0)
Hemoglobin: 16.9 g/dL (ref 13.0–17.7)
Immature Grans (Abs): 0 10*3/uL (ref 0.0–0.1)
Immature Granulocytes: 1 %
Lymphocytes Absolute: 1.4 10*3/uL (ref 0.7–3.1)
Lymphs: 27 %
MCH: 34.2 pg — ABNORMAL HIGH (ref 26.6–33.0)
MCHC: 33.7 g/dL (ref 31.5–35.7)
MCV: 101 fL — ABNORMAL HIGH (ref 79–97)
Monocytes Absolute: 0.4 10*3/uL (ref 0.1–0.9)
Monocytes: 7 %
Neutrophils Absolute: 3.4 10*3/uL (ref 1.4–7.0)
Neutrophils: 61 %
RBC: 4.94 x10E6/uL (ref 4.14–5.80)
RDW: 12.7 % (ref 11.6–15.4)
WBC: 5.4 10*3/uL (ref 3.4–10.8)

## 2019-03-13 LAB — COMPREHENSIVE METABOLIC PANEL
ALT: 66 IU/L — ABNORMAL HIGH (ref 0–44)
AST: 47 IU/L — ABNORMAL HIGH (ref 0–40)
Albumin/Globulin Ratio: 2.4 — ABNORMAL HIGH (ref 1.2–2.2)
Albumin: 4.5 g/dL (ref 4.0–5.0)
Alkaline Phosphatase: 56 IU/L (ref 39–117)
BUN/Creatinine Ratio: 17 (ref 9–20)
BUN: 16 mg/dL (ref 6–24)
Bilirubin Total: 0.6 mg/dL (ref 0.0–1.2)
CO2: 26 mmol/L (ref 20–29)
Calcium: 9.8 mg/dL (ref 8.7–10.2)
Chloride: 100 mmol/L (ref 96–106)
Creatinine, Ser: 0.92 mg/dL (ref 0.76–1.27)
GFR calc Af Amer: 114 mL/min/{1.73_m2} (ref >59)
GFR calc non Af Amer: 99 mL/min/{1.73_m2} (ref >59)
Globulin, Total: 1.9 g/dL (ref 1.5–4.5)
Glucose: 97 mg/dL (ref 65–99)
Potassium: 4.7 mmol/L (ref 3.5–5.2)
Sodium: 143 mmol/L (ref 134–144)
Total Protein: 6.4 g/dL (ref 6.0–8.5)

## 2019-03-13 LAB — LIPID PANEL WITH LDL/HDL RATIO
Cholesterol, Total: 200 mg/dL — ABNORMAL HIGH (ref 100–199)
HDL: 40 mg/dL (ref >39)
LDL Calculated: 117 mg/dL — ABNORMAL HIGH (ref 0–99)
LDl/HDL Ratio: 2.9 ratio (ref 0.0–3.6)
Triglycerides: 213 mg/dL — ABNORMAL HIGH (ref 0–149)
VLDL Cholesterol Cal: 43 mg/dL — ABNORMAL HIGH (ref 5–40)

## 2019-03-13 LAB — VITAMIN B12: Vitamin B-12: 859 pg/mL (ref 232–1245)

## 2019-03-13 LAB — T4, FREE: Free T4: 1.32 ng/dL (ref 0.82–1.77)

## 2019-03-13 LAB — TSH: TSH: 2.13 u[IU]/mL (ref 0.450–4.500)

## 2019-03-13 LAB — VITAMIN D 25 HYDROXY (VIT D DEFICIENCY, FRACTURES): Vit D, 25-Hydroxy: 30.6 ng/mL (ref 30.0–100.0)

## 2019-03-13 LAB — HEMOGLOBIN A1C
Est. average glucose Bld gHb Est-mCnc: 103 mg/dL
Hgb A1c MFr Bld: 5.2 % (ref 4.8–5.6)

## 2019-03-13 LAB — FOLATE: Folate: >20 ng/mL (ref >3.0)

## 2019-03-13 LAB — INSULIN, RANDOM: INSULIN: 31.1 u[IU]/mL — ABNORMAL HIGH (ref 2.6–24.9)

## 2019-03-13 LAB — T3: T3, Total: 119 ng/dL (ref 71–180)

## 2019-03-14 MED FILL — ADDERALL XR 20 MG CAP SA: 20 | 30 days supply | Qty: 30 | Fill #0

## 2019-03-20 MED FILL — LATUDA 40 MG TABLET: 40 | 90 days supply | Qty: 90 | Fill #0

## 2019-03-20 MED FILL — DIVALPROEX SOD ER 500 MG TA: 500 | 90 days supply | Qty: 180 | Fill #0

## 2019-03-20 MED FILL — ATORVASTATIN 40 MG TABLET: 40 | 30 days supply | Qty: 30 | Fill #1

## 2019-03-25 MED FILL — MIRTAZAPINE 15 MG TABLET: 15 | 30 days supply | Qty: 30 | Fill #1

## 2019-03-26 ENCOUNTER — Encounter (INDEPENDENT_AMBULATORY_CARE_PROVIDER_SITE_OTHER): Payer: Self-pay | Admitting: Family Medicine

## 2019-03-26 ENCOUNTER — Ambulatory Visit (INDEPENDENT_AMBULATORY_CARE_PROVIDER_SITE_OTHER): Payer: 59 | Admitting: Family Medicine

## 2019-03-26 ENCOUNTER — Other Ambulatory Visit: Payer: Self-pay

## 2019-03-26 VITALS — BP 124/86 | HR 88 | Temp 98.3°F | Ht 74.0 in | Wt 259.0 lb

## 2019-03-26 DIAGNOSIS — E782 Mixed hyperlipidemia: Secondary | ICD-10-CM | POA: Diagnosis not present

## 2019-03-26 DIAGNOSIS — Z9189 Other specified personal risk factors, not elsewhere classified: Secondary | ICD-10-CM | POA: Diagnosis not present

## 2019-03-26 DIAGNOSIS — E559 Vitamin D deficiency, unspecified: Secondary | ICD-10-CM

## 2019-03-26 DIAGNOSIS — E8881 Metabolic syndrome: Secondary | ICD-10-CM

## 2019-03-26 DIAGNOSIS — E669 Obesity, unspecified: Secondary | ICD-10-CM

## 2019-03-26 DIAGNOSIS — Z6833 Body mass index (BMI) 33.0-33.9, adult: Secondary | ICD-10-CM | POA: Diagnosis not present

## 2019-03-26 MED ORDER — METFORMIN HCL 500 MG PO TABS: 500.0000 mg | ORAL_TABLET | Freq: Every day | ORAL | 0 refills | Status: DC

## 2019-03-26 MED ORDER — VITAMIN D (ERGOCALCIFEROL) 1.25 MG (50000 UNIT) PO CAPS: 50000.0000 [IU] | ORAL_CAPSULE | ORAL | 0 refills | Status: DC

## 2019-03-26 MED FILL — VIT D2 1.25 MG (50,000 UNIT: 1.25 MG | 28 days supply | Qty: 4 | Fill #0

## 2019-03-26 MED FILL — metFORMIN HCL 500 MG TABS: 500 | 30 days supply | Qty: 30 | Fill #0

## 2019-03-27 NOTE — Progress Notes (Signed)
Office: [phone]  /  Fax: [phone]   HPI:   Chief Complaint: OBESITY Brian Cunningham is here to discuss his progress with his obesity treatment plan. He is on the Category 4 plan and is following his eating plan approximately 75 % of the time. He states he is doing short walks with the dogs 30 minutes 3 times per week. Brian Cunningham did well with his Category 4 plan. He struggled with some late afternoon hunger, and he drank beer, which he did not subtract from his snack calories. His weight is 259 lb (117.5 kg) today and has had a weight loss of 3 pounds over a period of 2 weeks since his last visit. He has lost 3 lbs since starting treatment with [name].  Mixed Hyperlipidemia Brian Cunningham has hyperlipidemia and he started statin last month. Brian Cunningham is attempting to improve his cholesterol levels with intensive lifestyle modification including a low saturated fat diet, exercise and weight loss. He denies any chest pain or myalgias.  Vitamin D deficiency (new) Brian Cunningham has a new diagnosis of vitamin D deficiency. He is currently taking a multi-vitamin and he is not yet at goal. Brian Cunningham denies nausea, vomiting or muscle weakness.  Insulin Resistance (new) Brian Cunningham has a new diagnosis of insulin resistance. He has a normal A1c and glucose, but elevated fasting insulin level >5. Although [name]'s blood glucose readings are still under good control, insulin resistance puts him at greater risk of metabolic syndrome and diabetes. He is working on diet and exercise to decrease risk of diabetes. [name] admits to polyphagia.  At risk for diabetes [name] is at higher than average risk for developing diabetes due to his obesity and insulin resistance. He currently denies polyuria or polydipsia.  ASSESSMENT AND PLAN:  Vitamin D deficiency - Plan: Vitamin D, Ergocalciferol, (DRISDOL) 1.25 MG (50000 UT) CAPS capsule  Insulin resistance - Plan: metFORMIN (GLUCOPHAGE) 500 MG  tablet  Mixed hyperlipidemia  At risk for diabetes mellitus  Class 1 obesity with serious comorbidity and body mass index (BMI) of 33.0 to 33.9 in adult, unspecified obesity type  PLAN:  Mixed Hyperlipidemia Brian Cunningham was informed of the American Heart Association Guidelines emphasizing intensive lifestyle modifications as the first line treatment for hyperlipidemia. We discussed many lifestyle modifications today in depth, and Brian Cunningham will continue to work on decreasing saturated fats such as fatty red meat, butter and many fried foods. He will also increase vegetables and lean protein in his diet and continue to work on exercise and weight loss efforts. Brian Cunningham will continue Lipitor and we will recheck labs in 3 months.  Vitamin D Deficiency (new) Brian Cunningham was informed that low vitamin D levels contributes to fatigue and are associated with obesity, breast, and colon cancer. He agrees to continue OTC multi-vitamin and start taking prescription Vit D @50 ,000 IU every week #4 with no refills and will follow up for routine testing of vitamin D, at least 2-3 times per year. He was informed of the risk of over-replacement of vitamin D and agrees to not increase his dose unless he discusses this with [name] first. [name] agrees to follow up with our clinic in 2 weeks.  Insulin Resistance (new) Brian Cunningham will continue to work on weight loss, exercise, and decreasing simple carbohydrates in his diet to help decrease the risk of diabetes. We dicussed metformin including benefits and risks. He was informed that eating too many simple carbohydrates or too many calories at one sitting increases the likelihood of GI side effects. Brian Cunningham agrees to start metformin  500 mg daily with breakfast #30 with no refills and follow up with [name] as directed to monitor his progress.  Diabetes risk counseling Brian Cunningham was given extended (15 minutes) diabetes prevention counseling today. He is 48 y.o.  male and has risk factors for diabetes including obesity and insulin resistance. We discussed intensive lifestyle modifications today with an emphasis on weight loss as well as increasing exercise and decreasing simple carbohydrates in his diet.  Obesity Brian Cunningham is currently in the action stage of change. As such, his goal is to continue with weight loss efforts He has agreed to follow the Category 4 plan Brian Cunningham has been instructed to work up to a goal of 150 minutes of combined cardio and strengthening exercise per week for weight loss and overall health benefits. We discussed the following Behavioral Modification Strategies today: planning for success, work on meal planning and easy cooking plans and decrease liquid calories  Brian Cunningham has agreed to follow up with our clinic in 2 weeks. He was informed of the importance of frequent follow up visits to maximize his success with intensive lifestyle modifications for his multiple health conditions.  ALLERGIES: No Known Allergies  MEDICATIONS: Current Outpatient Medications on File Prior to Visit  Medication Sig Dispense Refill   amphetamine-dextroamphetamine (ADDERALL XR) 20 MG 24 hr capsule Take 20 mg by mouth daily as needed.     Ascorbic Acid (VITAMIN C) 1000 MG tablet Take 1,000 mg by mouth daily.     aspirin EC 81 MG tablet Take 81 mg by mouth daily.     atorvastatin (LIPITOR) 40 MG tablet Take 40 mg by mouth daily.     diphenhydrAMINE (BENADRYL) 25 MG tablet Take 75 mg by mouth at bedtime.     divalproex (DEPAKOTE ER) 500 MG 24 hr tablet Take 500 mg by mouth 2 (two) times daily.      glucosamine-chondroitin 500-400 MG tablet Take 3 tablets by mouth daily.     ibuprofen (ADVIL) 200 MG tablet Take 600 mg by mouth every 6 (six) hours as needed.     levothyroxine (SYNTHROID) 88 MCG tablet Take 88 mcg by mouth daily before breakfast.     loperamide (IMODIUM) 2 MG capsule Take 12 mg by mouth as needed for diarrhea or  loose stools.     lurasidone (LATUDA) 40 MG TABS Take 40 mg by mouth every evening.     mirtazapine (REMERON) 15 MG tablet Take 15 mg by mouth at bedtime.     Multiple Vitamin (MULTIVITAMIN WITH MINERALS) TABS Take 1 tablet by mouth daily.     Omega-3 Fatty Acids (FISH OIL) 1200 MG CPDR Take 1,200 mg by mouth.     omeprazole (PRILOSEC) 40 MG capsule Take 40 mg by mouth 2 (two) times daily.      sildenafil (VIAGRA) 50 MG tablet Take 50 mg by mouth as needed for erectile dysfunction.     venlafaxine XR (EFFEXOR-XR) 75 MG 24 hr capsule Take 75 mg by mouth daily.     No current facility-administered medications on file prior to visit.     PAST MEDICAL HISTORY: Past Medical History:  Diagnosis Date   ADHD    Ankle pain    Back pain    Bipolar disorder (HCC)    Esophageal spasm    GERD (gastroesophageal reflux disease)    High cholesterol    Hypothyroidism    IBS (irritable bowel syndrome)    Insomnia     PAST SURGICAL HISTORY: Past Surgical History:  Procedure  Laterality Date   APPENDECTOMY     INGUINAL HERNIA REPAIR     VASECTOMY      SOCIAL HISTORY: Social History   Tobacco Use   Smoking status: Current Some Day Smoker    Types: Cigars   Smokeless tobacco: Never Used   Tobacco comment: One cigar every 2 months or so  Substance Use Topics   Alcohol use: Yes   Drug use: Not on file    FAMILY HISTORY: Family History  Problem Relation Age of Onset   Thyroid disease Mother    Cancer Mother    Depression Mother    Obesity Mother    Hyperlipidemia Father    Stroke Father     ROS: Review of Systems  Constitutional: Positive for weight loss.  Cardiovascular: Negative for chest pain.  Gastrointestinal: Negative for nausea and vomiting.  Genitourinary: Negative for frequency.  Musculoskeletal: Negative for myalgias.       Negative for muscle weakness  Endo/Heme/Allergies: Negative for polydipsia.       Positive for polyphagia     PHYSICAL EXAM: Blood pressure 124/86, pulse 88, temperature 98.3 F (36.8 C), temperature source Oral, height 6\' 2"  (1.88 m), weight 259 lb (117.5 kg), SpO2 97 %. Body mass index is 33.25 kg/m. Physical Exam Vitals signs reviewed.  Constitutional:      Appearance: Normal appearance. He is well-developed. He is obese.  Cardiovascular:     Rate and Rhythm: Normal rate.  Pulmonary:     Effort: Pulmonary effort is normal.  Musculoskeletal: Normal range of motion.  Skin:    General: Skin is warm and dry.  Neurological:     Mental Status: He is alert and oriented to person, place, and time.  Psychiatric:        Mood and Affect: Mood normal.        Behavior: Behavior normal.     RECENT LABS AND TESTS: BMET    Component Value Date/Time   NA 143 03/12/2019 1023   K 4.7 03/12/2019 1023   CL 100 03/12/2019 1023   CO2 26 03/12/2019 1023   GLUCOSE 97 03/12/2019 1023   BUN 16 03/12/2019 1023   CREATININE 0.92 03/12/2019 1023   CALCIUM 9.8 03/12/2019 1023   GFRNONAA 99 03/12/2019 1023   GFRAA 114 03/12/2019 1023   Lab Results  Component Value Date   HGBA1C 5.2 03/12/2019   Lab Results  Component Value Date   INSULIN 31.1 (H) 03/12/2019   CBC    Component Value Date/Time   WBC 5.4 03/12/2019 1023   RBC 4.94 03/12/2019 1023   HGB 16.9 03/12/2019 1023   HCT 50.1 03/12/2019 1023   MCV 101 (H) 03/12/2019 1023   MCH 34.2 (H) 03/12/2019 1023   MCHC 33.7 03/12/2019 1023   RDW 12.7 03/12/2019 1023   LYMPHSABS 1.4 03/12/2019 1023   EOSABS 0.2 03/12/2019 1023   BASOSABS 0.0 03/12/2019 1023   Iron/TIBC/Ferritin/ %Sat No results found for: IRON, TIBC, FERRITIN, IRONPCTSAT Lipid Panel     Component Value Date/Time   CHOL 200 (H) 03/12/2019 1023   TRIG 213 (H) 03/12/2019 1023   HDL 40 03/12/2019 1023   LDLCALC 117 (H) 03/12/2019 1023   Hepatic Function Panel     Component Value Date/Time   PROT 6.4 03/12/2019 1023   ALBUMIN 4.5 03/12/2019 1023   AST 47 (H)  03/12/2019 1023   ALT 66 (H) 03/12/2019 1023   ALKPHOS 56 03/12/2019 1023   BILITOT 0.6 03/12/2019 1023  Component Value Date/Time   TSH 2.130 03/12/2019 1023     Ref. Range 03/12/2019 10:23  Vitamin D, 25-Hydroxy Latest Ref Range: 30.0 - 100.0 ng/mL 30.6    OBESITY BEHAVIORAL INTERVENTION VISIT  Today's visit was # 2   Starting weight: 262 lbs Starting date: 03/12/2019 Today's weight : 259 lbs Today's date: 03/26/2019 Total lbs lost to date: 3    03/26/2019  Height 6\' 2"  (1.88 m)  Weight 259 lb (117.5 kg)  BMI (Calculated) 33.24  BLOOD PRESSURE - SYSTOLIC 446  BLOOD PRESSURE - DIASTOLIC 86   Body Fat % 95.0 %  Total Body Water (lbs) 117.8 lbs     ASK: We discussed the diagnosis of obesity with Mariam Dollar today and Talan agreed to give [name] permission to discuss obesity behavioral modification therapy today.  ASSESS: Toma has the diagnosis of obesity and his BMI today is 33.24 Carols is in the action stage of change   ADVISE: Rasheed was educated on the multiple health risks of obesity as well as the benefit of weight loss to improve his health. He was advised of the need for long term treatment and the importance of lifestyle modifications to improve his current health and to decrease his risk of future health problems.  AGREE: Multiple dietary modification options and treatment options were discussed and  Dartanian agreed to follow the recommendations documented in the above note.  ARRANGE: Terrill was educated on the importance of frequent visits to treat obesity as outlined per CMS and USPSTF guidelines and agreed to schedule his next follow up appointment today.  I, Doreene Nest, am acting as transcriptionist for Dennard Nip, MD I have reviewed the above documentation for accuracy and completeness, and I agree with the above. -Dennard Nip, MD

## 2019-04-01 MED FILL — SILDENAFIL CITRATE 50 MG TA: 50 | 50 days supply | Qty: 10 | Fill #1

## 2019-04-09 ENCOUNTER — Encounter (INDEPENDENT_AMBULATORY_CARE_PROVIDER_SITE_OTHER): Payer: Self-pay | Admitting: Family Medicine

## 2019-04-09 ENCOUNTER — Ambulatory Visit (INDEPENDENT_AMBULATORY_CARE_PROVIDER_SITE_OTHER): Payer: 59 | Admitting: Family Medicine

## 2019-04-09 ENCOUNTER — Other Ambulatory Visit: Payer: Self-pay

## 2019-04-09 VITALS — BP 117/78 | HR 80 | Temp 97.9°F | Ht 74.0 in | Wt 256.0 lb

## 2019-04-09 DIAGNOSIS — Z6833 Body mass index (BMI) 33.0-33.9, adult: Secondary | ICD-10-CM

## 2019-04-09 DIAGNOSIS — E559 Vitamin D deficiency, unspecified: Secondary | ICD-10-CM | POA: Diagnosis not present

## 2019-04-09 DIAGNOSIS — R7303 Prediabetes: Secondary | ICD-10-CM

## 2019-04-09 DIAGNOSIS — E669 Obesity, unspecified: Secondary | ICD-10-CM | POA: Diagnosis not present

## 2019-04-09 DIAGNOSIS — Z9189 Other specified personal risk factors, not elsewhere classified: Secondary | ICD-10-CM | POA: Diagnosis not present

## 2019-04-09 MED ORDER — VITAMIN D (ERGOCALCIFEROL) 1.25 MG (50000 UNIT) PO CAPS: 50000.0000 [IU] | ORAL_CAPSULE | ORAL | 0 refills | Status: DC

## 2019-04-09 MED ORDER — METFORMIN HCL 500 MG PO TABS: 500.0000 mg | ORAL_TABLET | Freq: Two times a day (BID) | ORAL | 0 refills | Status: DC

## 2019-04-10 MED FILL — metFORMIN HCL 500 MG TABS: 500 | 30 days supply | Qty: 60 | Fill #0

## 2019-04-10 NOTE — Progress Notes (Signed)
Office: [phone]  /  Fax: [phone]   HPI:   Chief Complaint: OBESITY Brian Cunningham is here to discuss his progress with his obesity treatment plan. He is on the Category 4 plan and is following his eating plan approximately 80 to 85 % of the time. He states he is walking more times per week. Brian Cunningham continues to do well with weight loss on his Category 4 plan, but notes increased afternoon hunger and is deviating a bit more for dinner.  His weight is 256 lb (116.1 kg) today and has had a weight loss of 3 pounds over a period of 2 weeks since his last visit. He has lost 6 lbs since starting treatment with [name].  Pre-Diabetes Brian Cunningham has a diagnosis of pre-diabetes based on his elevated Hgb A1c and was informed this puts him at greater risk of developing diabetes. He is doing well with his morning metformin, but still notes increased afternoon polyphagia. He continues to work on diet and exercise to decrease risk of diabetes. He denies nausea, vomiting, or diarrhea.    At risk for diabetes Brian Cunningham is at higher than average risk for developing diabetes due to his pre-diabetes and obesity.   Vitamin D Deficiency Brian Cunningham has a diagnosis of vitamin D deficiency. He is currently stable on vit D, but is not yet at goal. Brian Cunningham denies nausea, vomiting, or muscle weakness.  ASSESSMENT AND PLAN:  Prediabetes - Plan: metFORMIN (GLUCOPHAGE) 500 MG tablet  Vitamin D deficiency - Plan: Vitamin D, Ergocalciferol, (DRISDOL) 1.25 MG (50000 UT) CAPS capsule  At risk for diabetes mellitus  Class 1 obesity with serious comorbidity and body mass index (BMI) of 33.0 to 33.9 in adult, unspecified obesity type  PLAN:  Pre-Diabetes Brian Cunningham will continue to work on weight loss, exercise, and decreasing simple carbohydrates in his diet to help decrease the risk of diabetes. He was informed that eating too many simple carbohydrates or too many calories at one sitting increases the  likelihood of GI side effects. Brian Cunningham agreed to increase metformin 500 mg to BID #60 with no refills and a prescription was written today. Brian Cunningham agreed to follow up with [name] as directed to monitor his progress in 2 weeks.  Diabetes risk counseling Brian Cunningham was given extended (15 minutes) diabetes prevention counseling today. He is 48 y.o. male and has risk factors for diabetes including pre-diabetes and obesity. We discussed intensive lifestyle modifications today with an emphasis on weight loss as well as increasing exercise and decreasing simple carbohydrates in his diet.  Vitamin D Deficiency Brian Cunningham was informed that low vitamin D levels contribute to fatigue and are associated with obesity, breast, and colon cancer. Brian Cunningham agrees to continue to take prescription Vit D @50 ,000 IU every week #4 with no refills and will follow up for routine testing of vitamin D, at least 2-3 times per year. He was informed of the risk of over-replacement of vitamin D and agrees to not increase his dose unless he discusses this with [name] first. Brian Cunningham agrees to follow up in 2 weeks as directed.  Obesity Brian Cunningham is currently in the action stage of change. As such, his goal is to continue with weight loss efforts. He has agreed to follow the Category 4 plan and to keep a food journal of 400 to 650 calories and 45+ grams of protein for supper.Brian Cunningham has been instructed to work up to a goal of 150 minutes of combined cardio and strengthening exercise per week for weight loss and overall health  benefits. We discussed the following Behavioral Modification Strategies today: increasing lean protein intake and decreasing simple carbohydrates.   Brian Cunningham has agreed to follow up with our clinic in 2 weeks. He was informed of the importance of frequent follow up visits to maximize his success with intensive lifestyle modifications for his multiple health conditions.  ALLERGIES: No Known  Allergies  MEDICATIONS: Current Outpatient Medications on File Prior to Visit  Medication Sig Dispense Refill  . amphetamine-dextroamphetamine (ADDERALL XR) 20 MG 24 hr capsule Take 20 mg by mouth daily as needed.    . Ascorbic Acid (VITAMIN C) 1000 MG tablet Take 1,000 mg by mouth daily.    Marland Kitchen aspirin EC 81 MG tablet Take 81 mg by mouth daily.    Marland Kitchen atorvastatin (LIPITOR) 40 MG tablet Take 40 mg by mouth daily.    . diphenhydrAMINE (BENADRYL) 25 MG tablet Take 75 mg by mouth at bedtime.    . divalproex (DEPAKOTE ER) 500 MG 24 hr tablet Take 500 mg by mouth 2 (two) times daily.     Marland Kitchen glucosamine-chondroitin 500-400 MG tablet Take 3 tablets by mouth daily.    Marland Kitchen ibuprofen (ADVIL) 200 MG tablet Take 600 mg by mouth every 6 (six) hours as needed.    Marland Kitchen levothyroxine (SYNTHROID) 88 MCG tablet Take 88 mcg by mouth daily before breakfast.    . loperamide (IMODIUM) 2 MG capsule Take 12 mg by mouth as needed for diarrhea or loose stools.    Marland Kitchen lurasidone (LATUDA) 40 MG TABS Take 40 mg by mouth every evening.    . mirtazapine (REMERON) 15 MG tablet Take 15 mg by mouth at bedtime.    . Multiple Vitamin (MULTIVITAMIN WITH MINERALS) TABS Take 1 tablet by mouth daily.    . Omega-3 Fatty Acids (FISH OIL) 1200 MG CPDR Take 1,200 mg by mouth.    Marland Kitchen omeprazole (PRILOSEC) 40 MG capsule Take 40 mg by mouth 2 (two) times daily.     . sildenafil (VIAGRA) 50 MG tablet Take 50 mg by mouth as needed for erectile dysfunction.    Marland Kitchen venlafaxine XR (EFFEXOR-XR) 75 MG 24 hr capsule Take 75 mg by mouth daily.     No current facility-administered medications on file prior to visit.     PAST MEDICAL HISTORY: Past Medical History:  Diagnosis Date  . ADHD   . Ankle pain   . Back pain   . Bipolar disorder ([place])   . Esophageal spasm   . GERD (gastroesophageal reflux disease)   . High cholesterol   . Hypothyroidism   . IBS (irritable bowel syndrome)   . Insomnia     PAST SURGICAL HISTORY: Past Surgical History:   Procedure Laterality Date  . APPENDECTOMY    . INGUINAL HERNIA REPAIR    . VASECTOMY      SOCIAL HISTORY: Social History   Tobacco Use  . Smoking status: Current Some Day Smoker    Types: Cigars  . Smokeless tobacco: Never Used  . Tobacco comment: One cigar every 2 months or so  Substance Use Topics  . Alcohol use: Yes  . Drug use: Not on file    FAMILY HISTORY: Family History  Problem Relation Age of Onset  . Thyroid disease Mother   . Cancer Mother   . Depression Mother   . Obesity Mother   . Hyperlipidemia Father   . Stroke Father     ROS: Review of Systems  Constitutional: Positive for weight loss.  Gastrointestinal: Negative for constipation,  nausea and vomiting.  Musculoskeletal:       Negative for muscle weakness.  Endo/Heme/Allergies:       Positive for polyphagia.    PHYSICAL EXAM: Blood pressure 117/78, pulse 80, temperature 97.9 F (36.6 C), temperature source Oral, height 6\' 2"  (1.88 m), weight 256 lb (116.1 kg), SpO2 96 %. Body mass index is 32.87 kg/m. Physical Exam Vitals signs reviewed.  Constitutional:      Appearance: Normal appearance. He is obese.  Cardiovascular:     Rate and Rhythm: Normal rate.  Pulmonary:     Effort: Pulmonary effort is normal.  Musculoskeletal: Normal range of motion.  Skin:    General: Skin is warm and dry.  Neurological:     Mental Status: He is alert and oriented to person, place, and time.  Psychiatric:        Mood and Affect: Mood normal.        Behavior: Behavior normal.     RECENT LABS AND TESTS: BMET    Component Value Date/Time   NA 143 03/12/2019 1023   K 4.7 03/12/2019 1023   CL 100 03/12/2019 1023   CO2 26 03/12/2019 1023   GLUCOSE 97 03/12/2019 1023   BUN 16 03/12/2019 1023   CREATININE 0.92 03/12/2019 1023   CALCIUM 9.8 03/12/2019 1023   GFRNONAA 99 03/12/2019 1023   GFRAA 114 03/12/2019 1023   Lab Results  Component Value Date   HGBA1C 5.2 03/12/2019   Lab Results  Component  Value Date   INSULIN 31.1 (H) 03/12/2019   CBC    Component Value Date/Time   WBC 5.4 03/12/2019 1023   RBC 4.94 03/12/2019 1023   HGB 16.9 03/12/2019 1023   HCT 50.1 03/12/2019 1023   MCV 101 (H) 03/12/2019 1023   MCH 34.2 (H) 03/12/2019 1023   MCHC 33.7 03/12/2019 1023   RDW 12.7 03/12/2019 1023   LYMPHSABS 1.4 03/12/2019 1023   EOSABS 0.2 03/12/2019 1023   BASOSABS 0.0 03/12/2019 1023   Iron/TIBC/Ferritin/ %Sat No results found for: IRON, TIBC, FERRITIN, IRONPCTSAT Lipid Panel     Component Value Date/Time   CHOL 200 (H) 03/12/2019 1023   TRIG 213 (H) 03/12/2019 1023   HDL 40 03/12/2019 1023   LDLCALC 117 (H) 03/12/2019 1023   Hepatic Function Panel     Component Value Date/Time   PROT 6.4 03/12/2019 1023   ALBUMIN 4.5 03/12/2019 1023   AST 47 (H) 03/12/2019 1023   ALT 66 (H) 03/12/2019 1023   ALKPHOS 56 03/12/2019 1023   BILITOT 0.6 03/12/2019 1023      Component Value Date/Time   TSH 2.130 03/12/2019 1023   Results for JURGEN, GROENEVELD (MRN [id number]) as of 04/10/2019 18:34  Ref. Range 03/12/2019 10:23  Vitamin D, 25-Hydroxy Latest Ref Range: 30.0 - 100.0 ng/mL 30.6     OBESITY BEHAVIORAL INTERVENTION VISIT  Today's visit was # 3   Starting weight: 262 lbs Starting date: 03/12/19 Today's weight : Weight: 256 lb (116.1 kg)  Today's date: 04/09/2019 Total lbs lost to date: 6    04/09/2019  Height 6\' 2"  (1.88 m)  Weight 256 lb (116.1 kg)  BMI (Calculated) 32.85  BLOOD PRESSURE - SYSTOLIC 756  BLOOD PRESSURE - DIASTOLIC 78   Body Fat % 43.3 %  Total Body Water (lbs) 117.8 lbs   ASK: We discussed the diagnosis of obesity with Mariam Dollar today and Cresencio agreed to give [name] permission to discuss obesity behavioral modification therapy today.  ASSESS: Brian Cunningham  has the diagnosis of obesity and his BMI today is 32.85. Xavi is in the action stage of change.   ADVISE: Sholom was educated on the multiple health risks of  obesity as well as the benefit of weight loss to improve his health. He was advised of the need for long term treatment and the importance of lifestyle modifications to improve his current health and to decrease his risk of future health problems.  AGREE: Multiple dietary modification options and treatment options were discussed and Rashawd agreed to follow the recommendations documented in the above note.  ARRANGE: Sam was educated on the importance of frequent visits to treat obesity as outlined per CMS and USPSTF guidelines and agreed to schedule his next follow up appointment today.  IMarcille Blanco, CMA, am acting as transcriptionist for Starlyn Skeans, MD  I have reviewed the above documentation for accuracy and completeness, and I agree with the above. -Dennard Nip, MD

## 2019-04-17 MED FILL — VIT D2 1.25 MG (50,000 UNIT: 1.25 MG | 28 days supply | Qty: 4 | Fill #0

## 2019-04-18 ENCOUNTER — Other Ambulatory Visit (INDEPENDENT_AMBULATORY_CARE_PROVIDER_SITE_OTHER): Payer: Self-pay | Admitting: Family Medicine

## 2019-04-18 DIAGNOSIS — E559 Vitamin D deficiency, unspecified: Secondary | ICD-10-CM

## 2019-04-18 MED FILL — ATORVASTATIN 40 MG TABLET: 40 | 30 days supply | Qty: 30 | Fill #2

## 2019-04-18 MED FILL — ADDERALL XR 20 MG CAP SA: 20 | 30 days supply | Qty: 30 | Fill #0

## 2019-04-18 MED FILL — MIRTAZAPINE 15 MG TABLET: 15 | 30 days supply | Qty: 30 | Fill #2

## 2019-04-19 MED FILL — VENLAFAXINE HCL ER 75 MG CA: 75 | 90 days supply | Qty: 90 | Fill #0

## 2019-05-01 ENCOUNTER — Telehealth (INDEPENDENT_AMBULATORY_CARE_PROVIDER_SITE_OTHER): Payer: 59 | Admitting: Family Medicine

## 2019-05-08 ENCOUNTER — Telehealth (INDEPENDENT_AMBULATORY_CARE_PROVIDER_SITE_OTHER): Payer: 59 | Admitting: Family Medicine

## 2019-05-09 MED FILL — OMEPRAZOLE DR 40 MG CAPSULE: 40 | 90 days supply | Qty: 90 | Fill #1

## 2019-05-13 MED FILL — SILDENAFIL CITRATE 50 MG TA: 50 | 50 days supply | Qty: 10 | Fill #2

## 2019-05-15 ENCOUNTER — Other Ambulatory Visit (INDEPENDENT_AMBULATORY_CARE_PROVIDER_SITE_OTHER): Payer: Self-pay | Admitting: Family Medicine

## 2019-05-15 DIAGNOSIS — E559 Vitamin D deficiency, unspecified: Secondary | ICD-10-CM

## 2019-05-15 MED FILL — ATORVASTATIN 40 MG TABLET: 40 | 30 days supply | Qty: 30 | Fill #3

## 2019-05-16 MED FILL — MIRTAZAPINE 15 MG TABLET: 15 | 30 days supply | Qty: 30 | Fill #0

## 2019-05-20 ENCOUNTER — Encounter (INDEPENDENT_AMBULATORY_CARE_PROVIDER_SITE_OTHER): Payer: Self-pay | Admitting: Family Medicine

## 2019-05-20 ENCOUNTER — Other Ambulatory Visit: Payer: Self-pay

## 2019-05-20 ENCOUNTER — Ambulatory Visit (INDEPENDENT_AMBULATORY_CARE_PROVIDER_SITE_OTHER): Payer: 59 | Admitting: Family Medicine

## 2019-05-20 VITALS — BP 97/67 | HR 92 | Temp 98.1°F | Ht 74.0 in | Wt 253.0 lb

## 2019-05-20 DIAGNOSIS — E669 Obesity, unspecified: Secondary | ICD-10-CM

## 2019-05-20 DIAGNOSIS — E559 Vitamin D deficiency, unspecified: Secondary | ICD-10-CM

## 2019-05-20 DIAGNOSIS — Z9189 Other specified personal risk factors, not elsewhere classified: Secondary | ICD-10-CM

## 2019-05-20 DIAGNOSIS — E8881 Metabolic syndrome: Secondary | ICD-10-CM

## 2019-05-20 DIAGNOSIS — Z6832 Body mass index (BMI) 32.0-32.9, adult: Secondary | ICD-10-CM

## 2019-05-20 MED ORDER — METFORMIN HCL 500 MG PO TABS: 500.0000 mg | ORAL_TABLET | Freq: Two times a day (BID) | ORAL | 0 refills | Status: DC

## 2019-05-20 MED ORDER — VITAMIN D (ERGOCALCIFEROL) 1.25 MG (50000 UNIT) PO CAPS: 50000.0000 [IU] | ORAL_CAPSULE | ORAL | 0 refills | Status: DC

## 2019-05-20 MED FILL — VIT D2 1.25 MG (50,000 UNIT: 1.25 MG | 28 days supply | Qty: 4 | Fill #0

## 2019-05-20 MED FILL — metFORMIN HCL 500 MG TABS: 500 | 30 days supply | Qty: 60 | Fill #0

## 2019-05-21 DIAGNOSIS — E785 Hyperlipidemia, unspecified: Secondary | ICD-10-CM | POA: Diagnosis not present

## 2019-05-21 MED FILL — SILDENAFIL CITRATE 100 MG T: 100 | 30 days supply | Qty: 6 | Fill #0

## 2019-05-22 MED ORDER — SAXENDA 18 MG/3ML ~~LOC~~ SOPN: 3.0000 mL | PEN_INJECTOR | Freq: Every day | SUBCUTANEOUS | 0 refills | Status: DC

## 2019-05-22 MED ORDER — INSULIN PEN NEEDLE 32G X 4 MM MISC: 0 refills | Status: DC

## 2019-05-22 NOTE — Progress Notes (Signed)
Office: [phone]  /  Fax: [phone]   HPI:   Chief Complaint: OBESITY Brian Cunningham is here to discuss his progress with his obesity treatment plan. He is on the keep a food journal with 400-650 calories and 45+ grams of protein at supper daily and follow the Category 4 plan and is following his eating plan approximately 75-80 % of the time. He states he is walking for 45-60 minutes 3 times per week. Brian Cunningham continues to do well on his Category 4. He notes increased polyphagia, mostly eating in the evenings. He wonders if Saxenda could help.  His weight is 253 lb (114.8 kg) today and has had a weight loss of 3 pounds over a period of 6 weeks since his last visit. He has lost 9 lbs since starting treatment with [name].  Insulin Resistance Brian Cunningham has a diagnosis of insulin resistance based on his elevated fasting insulin level >5. Although Brian Cunningham's blood glucose readings are still under good control, insulin resistance puts him at greater risk of metabolic syndrome and diabetes. He is working on diet and tolerating metformin well. He wonders if a GLP-1 would help with his polyphagia.  Vitamin D Deficiency Brian Cunningham has a diagnosis of vitamin D deficiency. He is stable on prescription Vit D and denies nausea, vomiting or muscle weakness.  ASSESSMENT AND PLAN:  Insulin resistance - Plan: metFORMIN (GLUCOPHAGE) 500 MG tablet  Vitamin D deficiency - Plan: Vitamin D, Ergocalciferol, (DRISDOL) 1.25 MG (50000 UT) CAPS capsule  Class 1 obesity with serious comorbidity and body mass index (BMI) of 32.0 to 32.9 in adult, unspecified obesity type  PLAN:  Insulin Resistance Brian Cunningham will continue to work on weight loss, exercise, and decreasing simple carbohydrates in his diet to help decrease the risk of diabetes. We dicussed metformin including benefits and risks. He was informed that eating too many simple carbohydrates or too many calories at one sitting increases the  likelihood of GI side effects. Brian Cunningham agrees to continue taking metformin 500 mg BID #60 and we will refill for 1 month, and start [place]. [name] agrees to follow up with our clinic in 2 to 3 weeks as directed to monitor his progress.  Diabetes risk counselling Brian Cunningham was given extended (15 minutes) diabetes prevention counseling today. He is 48 y.o. male and has risk factors for diabetes including obesity. We discussed intensive lifestyle modifications today with an emphasis on weight loss as well as increasing exercise and decreasing simple carbohydrates in his diet.  Vitamin D Deficiency Brian Cunningham was informed that low vitamin D levels contributes to fatigue and are associated with obesity, breast, and colon cancer. Brian Cunningham agrees to continue taking prescription Vit D 50,000 IU every week #4 and we will refill for 1 month. He will follow up for routine testing of vitamin D, at least 2-3 times per year. He was informed of the risk of over-replacement of vitamin D and agrees to not increase his dose unless he discusses this with [name] first. Brian Cunningham agrees to follow up with our clinic in 2 to 3 weeks.  Obesity Brian Cunningham is currently in the action stage of change. As such, his goal is to continue with weight loss efforts He has agreed to keep a food journal with 400-650 calories and 45+ grams of protein at supper daily and follow the Category 4 plan Brian Cunningham has been instructed to work up to a goal of 150 minutes of combined cardio and strengthening exercise per week for weight loss and overall health benefits. We discussed the  following Behavioral Modification Strategies today: increasing lean protein intake, decreasing simple carbohydrates  and work on meal planning and easy cooking plans We discussed various medication options to help Brian Cunningham with his weight loss efforts and we both agreed to start Saxenda 3.0 mg SubQ daily #5 pens with no refills, and nano needles  #100 with no refills.   Brian Cunningham has agreed to follow up with our clinic in 2 to 3 weeks. He was informed of the importance of frequent follow up visits to maximize his success with intensive lifestyle modifications for his multiple health conditions.  ALLERGIES: No Known Allergies  MEDICATIONS: Current Outpatient Medications on File Prior to Visit  Medication Sig Dispense Refill  . amphetamine-dextroamphetamine (ADDERALL XR) 20 MG 24 hr capsule Take 20 mg by mouth daily as needed.    . Ascorbic Acid (VITAMIN C) 1000 MG tablet Take 1,000 mg by mouth daily.    Marland Kitchen aspirin EC 81 MG tablet Take 81 mg by mouth daily.    Marland Kitchen atorvastatin (LIPITOR) 40 MG tablet Take 40 mg by mouth daily.    . diphenhydrAMINE (BENADRYL) 25 MG tablet Take 75 mg by mouth at bedtime.    . divalproex (DEPAKOTE ER) 500 MG 24 hr tablet Take 500 mg by mouth 2 (two) times daily.     Marland Kitchen glucosamine-chondroitin 500-400 MG tablet Take 3 tablets by mouth daily.    Marland Kitchen ibuprofen (ADVIL) 200 MG tablet Take 600 mg by mouth every 6 (six) hours as needed.    Marland Kitchen levothyroxine (SYNTHROID) 88 MCG tablet Take 88 mcg by mouth daily before breakfast.    . loperamide (IMODIUM) 2 MG capsule Take 12 mg by mouth as needed for diarrhea or loose stools.    Marland Kitchen lurasidone (LATUDA) 40 MG TABS Take 40 mg by mouth every evening.    . mirtazapine (REMERON) 15 MG tablet Take 15 mg by mouth at bedtime.    . Multiple Vitamin (MULTIVITAMIN WITH MINERALS) TABS Take 1 tablet by mouth daily.    . Omega-3 Fatty Acids (FISH OIL) 1200 MG CPDR Take 1,200 mg by mouth.    Marland Kitchen omeprazole (PRILOSEC) 40 MG capsule Take 40 mg by mouth 2 (two) times daily.     . sildenafil (VIAGRA) 50 MG tablet Take 50 mg by mouth as needed for erectile dysfunction.    Marland Kitchen venlafaxine XR (EFFEXOR-XR) 75 MG 24 hr capsule Take 75 mg by mouth daily.     No current facility-administered medications on file prior to visit.     PAST MEDICAL HISTORY: Past Medical History:  Diagnosis Date  .  ADHD   . Ankle pain   . Back pain   . Bipolar disorder ([place])   . Esophageal spasm   . GERD (gastroesophageal reflux disease)   . High cholesterol   . Hypothyroidism   . IBS (irritable bowel syndrome)   . Insomnia     PAST SURGICAL HISTORY: Past Surgical History:  Procedure Laterality Date  . APPENDECTOMY    . INGUINAL HERNIA REPAIR    . VASECTOMY      SOCIAL HISTORY: Social History   Tobacco Use  . Smoking status: Current Some Day Smoker    Types: Cigars  . Smokeless tobacco: Never Used  . Tobacco comment: One cigar every 2 months or so  Substance Use Topics  . Alcohol use: Yes  . Drug use: Not on file    FAMILY HISTORY: Family History  Problem Relation Age of Onset  . Thyroid disease Mother   .  Cancer Mother   . Depression Mother   . Obesity Mother   . Hyperlipidemia Father   . Stroke Father     ROS: Review of Systems  Constitutional: Positive for weight loss.  Gastrointestinal: Negative for nausea and vomiting.  Musculoskeletal:       Negative muscle weakness  Endo/Heme/Allergies:       Positive polyphagia    PHYSICAL EXAM: Blood pressure 97/67, pulse 92, temperature 98.1 F (36.7 C), temperature source Oral, height 6\' 2"  (1.88 m), weight 253 lb (114.8 kg), SpO2 95 %. Body mass index is 32.48 kg/m. Physical Exam Vitals signs reviewed.  Constitutional:      Appearance: Normal appearance. He is obese.  Cardiovascular:     Rate and Rhythm: Normal rate.     Pulses: Normal pulses.  Pulmonary:     Effort: Pulmonary effort is normal.     Breath sounds: Normal breath sounds.  Musculoskeletal: Normal range of motion.  Skin:    General: Skin is warm and dry.  Neurological:     Mental Status: He is alert and oriented to person, place, and time.  Psychiatric:        Mood and Affect: Mood normal.        Behavior: Behavior normal.     RECENT LABS AND TESTS: BMET    Component Value Date/Time   NA 143 03/12/2019 1023   K 4.7 03/12/2019 1023   CL  100 03/12/2019 1023   CO2 26 03/12/2019 1023   GLUCOSE 97 03/12/2019 1023   BUN 16 03/12/2019 1023   CREATININE 0.92 03/12/2019 1023   CALCIUM 9.8 03/12/2019 1023   GFRNONAA 99 03/12/2019 1023   GFRAA 114 03/12/2019 1023   Lab Results  Component Value Date   HGBA1C 5.2 03/12/2019   Lab Results  Component Value Date   INSULIN 31.1 (H) 03/12/2019   CBC    Component Value Date/Time   WBC 5.4 03/12/2019 1023   RBC 4.94 03/12/2019 1023   HGB 16.9 03/12/2019 1023   HCT 50.1 03/12/2019 1023   MCV 101 (H) 03/12/2019 1023   MCH 34.2 (H) 03/12/2019 1023   MCHC 33.7 03/12/2019 1023   RDW 12.7 03/12/2019 1023   LYMPHSABS 1.4 03/12/2019 1023   EOSABS 0.2 03/12/2019 1023   BASOSABS 0.0 03/12/2019 1023   Iron/TIBC/Ferritin/ %Sat No results found for: IRON, TIBC, FERRITIN, IRONPCTSAT Lipid Panel     Component Value Date/Time   CHOL 200 (H) 03/12/2019 1023   TRIG 213 (H) 03/12/2019 1023   HDL 40 03/12/2019 1023   LDLCALC 117 (H) 03/12/2019 1023   Hepatic Function Panel     Component Value Date/Time   PROT 6.4 03/12/2019 1023   ALBUMIN 4.5 03/12/2019 1023   AST 47 (H) 03/12/2019 1023   ALT 66 (H) 03/12/2019 1023   ALKPHOS 56 03/12/2019 1023   BILITOT 0.6 03/12/2019 1023      Component Value Date/Time   TSH 2.130 03/12/2019 1023      OBESITY BEHAVIORAL INTERVENTION VISIT  Today's visit was # 4   Starting weight: 262 lbs Starting date: 03/12/2019 Today's weight : 253 lbs Today's date: 05/20/2019 Total lbs lost to date: 9    ASK: We discussed the diagnosis of obesity with Mariam Dollar today and Tiyon agreed to give [name] permission to discuss obesity behavioral modification therapy today.  ASSESS: Dino has the diagnosis of obesity and his BMI today is 32.47 Najay is in the action stage of change   ADVISE: Jude  was educated on the multiple health risks of obesity as well as the benefit of weight loss to improve his health. He was  advised of the need for long term treatment and the importance of lifestyle modifications to improve his current health and to decrease his risk of future health problems.  AGREE: Multiple dietary modification options and treatment options were discussed and  Eian agreed to follow the recommendations documented in the above note.  ARRANGE: Talib was educated on the importance of frequent visits to treat obesity as outlined per CMS and USPSTF guidelines and agreed to schedule his next follow up appointment today.  I, Trixie Dredge, am acting as transcriptionist for Dennard Nip, MD I have reviewed the above documentation for accuracy and completeness, and I agree with the above. -Dennard Nip, MD

## 2019-05-24 MED FILL — UNIFINE PENTIPS 32GX5/32": 32G X 4 MM | 90 days supply | Qty: 100 | Fill #0

## 2019-05-24 MED FILL — UNIFINE PENTIPS 32GX5/32: 32G X 4 MM | 90 days supply | Qty: 100 | Fill #0

## 2019-05-27 MED FILL — LEVOTHYROXINE 88 MCG TABLET: 88 | 90 days supply | Qty: 90 | Fill #1

## 2019-05-27 MED FILL — SAXENDA 18 MG/3 ML PEN: 18 | 30 days supply | Qty: 15 | Fill #0

## 2019-06-03 ENCOUNTER — Ambulatory Visit (INDEPENDENT_AMBULATORY_CARE_PROVIDER_SITE_OTHER): Payer: 59 | Admitting: Family Medicine

## 2019-06-10 MED FILL — ATORVASTATIN 40 MG TABLET: 40 | 30 days supply | Qty: 30 | Fill #4

## 2019-06-13 ENCOUNTER — Ambulatory Visit (INDEPENDENT_AMBULATORY_CARE_PROVIDER_SITE_OTHER): Payer: 59 | Admitting: Bariatrics

## 2019-06-13 ENCOUNTER — Other Ambulatory Visit: Payer: Self-pay

## 2019-06-13 VITALS — BP 107/72 | HR 77 | Temp 99.1°F | Ht 74.0 in | Wt 248.0 lb

## 2019-06-13 DIAGNOSIS — E559 Vitamin D deficiency, unspecified: Secondary | ICD-10-CM

## 2019-06-13 DIAGNOSIS — E88819 Insulin resistance, unspecified: Secondary | ICD-10-CM

## 2019-06-13 DIAGNOSIS — E8881 Metabolic syndrome: Secondary | ICD-10-CM | POA: Diagnosis not present

## 2019-06-13 DIAGNOSIS — Z9189 Other specified personal risk factors, not elsewhere classified: Secondary | ICD-10-CM

## 2019-06-13 DIAGNOSIS — E669 Obesity, unspecified: Secondary | ICD-10-CM | POA: Diagnosis not present

## 2019-06-13 DIAGNOSIS — E66811 Obesity, class 1: Secondary | ICD-10-CM

## 2019-06-13 DIAGNOSIS — Z6831 Body mass index (BMI) 31.0-31.9, adult: Secondary | ICD-10-CM | POA: Diagnosis not present

## 2019-06-13 MED ORDER — METFORMIN HCL 500 MG PO TABS: 500.0000 mg | ORAL_TABLET | Freq: Two times a day (BID) | ORAL | 0 refills | Status: DC

## 2019-06-13 MED FILL — DIVALPROEX SOD ER 500 MG TA: 500 | 90 days supply | Qty: 180 | Fill #0

## 2019-06-13 MED FILL — metFORMIN HCL 500 MG TABS: 500 | 30 days supply | Qty: 60 | Fill #0

## 2019-06-14 MED FILL — SILDENAFIL CITRATE 100 MG T: 100 | 30 days supply | Qty: 6 | Fill #1

## 2019-06-17 DIAGNOSIS — F902 Attention-deficit hyperactivity disorder, combined type: Secondary | ICD-10-CM | POA: Diagnosis not present

## 2019-06-17 DIAGNOSIS — G4700 Insomnia, unspecified: Secondary | ICD-10-CM | POA: Diagnosis not present

## 2019-06-17 DIAGNOSIS — F3132 Bipolar disorder, current episode depressed, moderate: Secondary | ICD-10-CM | POA: Diagnosis not present

## 2019-06-17 MED FILL — traZODone HCL 100 MG TABS: 100 | 90 days supply | Qty: 90 | Fill #0

## 2019-06-17 MED FILL — LATUDA 40 MG TABLET: 40 | 90 days supply | Qty: 90 | Fill #0

## 2019-06-17 MED FILL — MIRTAZAPINE 15 MG TABLET: 15 | 90 days supply | Qty: 90 | Fill #0

## 2019-06-17 MED FILL — ADDERALL XR 20 MG CAP SA: 20 | 30 days supply | Qty: 30 | Fill #0

## 2019-06-18 ENCOUNTER — Encounter (INDEPENDENT_AMBULATORY_CARE_PROVIDER_SITE_OTHER): Payer: Self-pay | Admitting: Bariatrics

## 2019-06-18 NOTE — Progress Notes (Signed)
Office: [phone]  /  Fax: [phone]   HPI:   Chief Complaint: OBESITY Queen is here to discuss his progress with his obesity treatment plan. He is on the Category 4 plan and is following his eating plan approximately 70% of the time. He states he is walking 30-45 minutes 3-4 times per week. Geo is a patient of Dr. Leafy Ro and this is his first visit with me. He is down 5 lbs and doing well overall. He is taking Saxenda 1.2 mg and wants to go up on his dose.  His weight is 248 lb (112.5 kg) today and has had a weight loss of 5 pounds over a period of 3 weeks since his last visit. He has lost 14 lbs since starting treatment with [name].  Insulin Resistance Alxander has a diagnosis of insulin resistance based on his elevated fasting insulin level >5. Last A1c 5.2 on 03/12/2019 with an insulin of 31.1. Although Toan's blood glucose readings are still under good control, insulin resistance puts him at greater risk of metabolic syndrome and diabetes. He is taking metformin currently and continues to work on diet and exercise to decrease risk of diabetes. No polyphagia.  Vitamin D deficiency Latrelle has a diagnosis of Vitamin D deficiency. Last Vitamin D 30.6 on 03/12/2019. He is currently taking Vit D and denies nausea, vomiting or muscle weakness.  At risk for osteopenia and osteoporosis Savaughn is at higher risk of osteopenia and osteoporosis due to Vitamin D deficiency.   ASSESSMENT AND PLAN:  Insulin resistance - Plan: metFORMIN (GLUCOPHAGE) 500 MG tablet  Vitamin D deficiency  At risk for osteoporosis  Class 1 obesity with serious comorbidity and body mass index (BMI) of 31.0 to 31.9 in adult, unspecified obesity type  PLAN:  Insulin Resistance Yohann will continue to work on weight loss, exercise, and decreasing simple carbohydrates in his diet to help decrease the risk of diabetes. We dicussed metformin including benefits and risks. He was  informed that eating too many simple carbohydrates or too many calories at one sitting increases the likelihood of GI side effects. Cecile was given a prescription for metformin 500 mg 1 PO BID #60 with 0 refills. He agrees to follow-up with our clinic in 2 weeks.  Vitamin D Deficiency Aniel was informed that low Vitamin D levels contributes to fatigue and are associated with obesity, breast, and colon cancer. He agrees to continue taking Vit D and will follow-up for routine testing of Vitamin D, at least 2-3 times per year. He was informed of the risk of over-replacement of Vitamin D and agrees to not increase his dose unless he discusses this with [name] first. Bradford agrees to follow-up with our clinic in 2 weeks.  At risk for osteopenia and osteoporosis Jeryl was given extended  (15 minutes) osteoporosis prevention counseling today. Keagon is at risk for osteopenia and osteoporosis due to his Vitamin D deficiency. He was encouraged to take his Vitamin D and follow his higher calcium diet and increase strengthening exercise to help strengthen his bones and decrease his risk of osteopenia and osteoporosis.  Obesity Mohamud is currently in the action stage of change. As such, his goal is to continue with weight loss efforts. He has agreed to follow the Category 4 plan. Breion will work on meal planning, intentional eating, and increasing his water intake. Deshae has been instructed to continue his current exercise regimen for weight loss and overall health benefits. We discussed the following Behavioral Modification Strategies today: increasing lean  protein intake, decreasing simple carbohydrates, increasing vegetables, increase H20 intake, decrease eating out, no skipping meals, work on meal planning and easy cooking plans, keeping healthy foods in the home, and planning for success.  Casin was given a prescription for an increase dose of Saxenda 1.8 mg  and will continue this dose until his next visit.  Dekwan has agreed to follow-up with our clinic in 2 weeks. He was informed of the importance of frequent follow-up visits to maximize his success with intensive lifestyle modifications for his multiple health conditions.  ALLERGIES: No Known Allergies  MEDICATIONS: Current Outpatient Medications on File Prior to Visit  Medication Sig Dispense Refill  . amphetamine-dextroamphetamine (ADDERALL XR) 20 MG 24 hr capsule Take 20 mg by mouth daily as needed.    . Ascorbic Acid (VITAMIN C) 1000 MG tablet Take 1,000 mg by mouth daily.    Marland Kitchen aspirin EC 81 MG tablet Take 81 mg by mouth daily.    Marland Kitchen atorvastatin (LIPITOR) 40 MG tablet Take 40 mg by mouth daily.    . diphenhydrAMINE (BENADRYL) 25 MG tablet Take 75 mg by mouth at bedtime.    . divalproex (DEPAKOTE ER) 500 MG 24 hr tablet Take 500 mg by mouth 2 (two) times daily.     Marland Kitchen glucosamine-chondroitin 500-400 MG tablet Take 3 tablets by mouth daily.    Marland Kitchen ibuprofen (ADVIL) 200 MG tablet Take 600 mg by mouth every 6 (six) hours as needed.    . Insulin Pen Needle 32G X 4 MM MISC Use with saxenda daily 100 each 0  . levothyroxine (SYNTHROID) 88 MCG tablet Take 88 mcg by mouth daily before breakfast.    . Liraglutide -Weight Management (SAXENDA) 18 MG/3ML SOPN Inject 3 mLs into the skin daily. 5 pen 0  . loperamide (IMODIUM) 2 MG capsule Take 12 mg by mouth as needed for diarrhea or loose stools.    Marland Kitchen lurasidone (LATUDA) 40 MG TABS Take 40 mg by mouth every evening.    . mirtazapine (REMERON) 15 MG tablet Take 15 mg by mouth at bedtime.    . Multiple Vitamin (MULTIVITAMIN WITH MINERALS) TABS Take 1 tablet by mouth daily.    . Omega-3 Fatty Acids (FISH OIL) 1200 MG CPDR Take 1,200 mg by mouth.    Marland Kitchen omeprazole (PRILOSEC) 40 MG capsule Take 40 mg by mouth 2 (two) times daily.     . sildenafil (VIAGRA) 50 MG tablet Take 50 mg by mouth as needed for erectile dysfunction.    Marland Kitchen venlafaxine XR  (EFFEXOR-XR) 75 MG 24 hr capsule Take 75 mg by mouth daily.    . Vitamin D, Ergocalciferol, (DRISDOL) 1.25 MG (50000 UT) CAPS capsule Take 1 capsule (50,000 Units total) by mouth every 7 (seven) days. 4 capsule 0   No current facility-administered medications on file prior to visit.     PAST MEDICAL HISTORY: Past Medical History:  Diagnosis Date  . ADHD   . Ankle pain   . Back pain   . Bipolar disorder ([place])   . Esophageal spasm   . GERD (gastroesophageal reflux disease)   . High cholesterol   . Hypothyroidism   . IBS (irritable bowel syndrome)   . Insomnia     PAST SURGICAL HISTORY: Past Surgical History:  Procedure Laterality Date  . APPENDECTOMY    . INGUINAL HERNIA REPAIR    . VASECTOMY      SOCIAL HISTORY: Social History   Tobacco Use  . Smoking status: Current Some Day Smoker  Types: Cigars  . Smokeless tobacco: Never Used  . Tobacco comment: One cigar every 2 months or so  Substance Use Topics  . Alcohol use: Yes  . Drug use: Not on file    FAMILY HISTORY: Family History  Problem Relation Age of Onset  . Thyroid disease Mother   . Cancer Mother   . Depression Mother   . Obesity Mother   . Hyperlipidemia Father   . Stroke Father    ROS: Review of Systems  Gastrointestinal: Negative for nausea and vomiting.  Musculoskeletal:       Negative for muscle weakness.  Endo/Heme/Allergies:       Negative for polyphagia.   PHYSICAL EXAM: Blood pressure 107/72, pulse 77, temperature 99.1 F (37.3 C), temperature source Oral, height 6\' 2"  (1.88 m), weight 248 lb (112.5 kg). Body mass index is 31.84 kg/m. Physical Exam Vitals signs reviewed.  Constitutional:      Appearance: Normal appearance. He is obese.  Cardiovascular:     Rate and Rhythm: Normal rate.     Pulses: Normal pulses.  Pulmonary:     Effort: Pulmonary effort is normal.     Breath sounds: Normal breath sounds.  Musculoskeletal: Normal range of motion.  Skin:    General: Skin is  warm and dry.  Neurological:     Mental Status: He is alert and oriented to person, place, and time.  Psychiatric:        Behavior: Behavior normal.   RECENT LABS AND TESTS: BMET    Component Value Date/Time   NA 143 03/12/2019 1023   K 4.7 03/12/2019 1023   CL 100 03/12/2019 1023   CO2 26 03/12/2019 1023   GLUCOSE 97 03/12/2019 1023   BUN 16 03/12/2019 1023   CREATININE 0.92 03/12/2019 1023   CALCIUM 9.8 03/12/2019 1023   GFRNONAA 99 03/12/2019 1023   GFRAA 114 03/12/2019 1023   Lab Results  Component Value Date   HGBA1C 5.2 03/12/2019   Lab Results  Component Value Date   INSULIN 31.1 (H) 03/12/2019   CBC    Component Value Date/Time   WBC 5.4 03/12/2019 1023   RBC 4.94 03/12/2019 1023   HGB 16.9 03/12/2019 1023   HCT 50.1 03/12/2019 1023   MCV 101 (H) 03/12/2019 1023   MCH 34.2 (H) 03/12/2019 1023   MCHC 33.7 03/12/2019 1023   RDW 12.7 03/12/2019 1023   LYMPHSABS 1.4 03/12/2019 1023   EOSABS 0.2 03/12/2019 1023   BASOSABS 0.0 03/12/2019 1023   Iron/TIBC/Ferritin/ %Sat No results found for: IRON, TIBC, FERRITIN, IRONPCTSAT Lipid Panel     Component Value Date/Time   CHOL 200 (H) 03/12/2019 1023   TRIG 213 (H) 03/12/2019 1023   HDL 40 03/12/2019 1023   LDLCALC 117 (H) 03/12/2019 1023   Hepatic Function Panel     Component Value Date/Time   PROT 6.4 03/12/2019 1023   ALBUMIN 4.5 03/12/2019 1023   AST 47 (H) 03/12/2019 1023   ALT 66 (H) 03/12/2019 1023   ALKPHOS 56 03/12/2019 1023   BILITOT 0.6 03/12/2019 1023      Component Value Date/Time   TSH 2.130 03/12/2019 1023   Results for NAKODA, COLLARD (MRN [id number]) as of 06/18/2019 14:38  Ref. Range 03/12/2019 10:23  Vitamin D, 25-Hydroxy Latest Ref Range: 30.0 - 100.0 ng/mL 30.6   OBESITY BEHAVIORAL INTERVENTION VISIT  Today's visit was #5  Starting weight: 262 lbs Starting date: 03/12/2019 Today's weight: 248 lbs  Today's date: 06/13/2019 Total lbs lost  to date: 14    06/13/2019   Height 6\' 2"  (1.88 m)  Weight 248 lb (112.5 kg)  BMI (Calculated) 31.83  BLOOD PRESSURE - SYSTOLIC [id number]  BLOOD PRESSURE - DIASTOLIC 72   Body Fat % [id number] %  Total Body Water (lbs) 115.2 lbs   ASK: We discussed the diagnosis of obesity with Mariam Dollar today and Arman agreed to give [name] permission to discuss obesity behavioral modification therapy today.  ASSESS: Reid has the diagnosis of obesity and his BMI today is 31.9. Damain is in the action stage of change.   ADVISE: Dejour was educated on the multiple health risks of obesity as well as the benefit of weight loss to improve his health. He was advised of the need for long term treatment and the importance of lifestyle modifications to improve his current health and to decrease his risk of future health problems.  AGREE: Multiple dietary modification options and treatment options were discussed and  Ulisses agreed to follow the recommendations documented in the above note.  ARRANGE: Armanni was educated on the importance of frequent visits to treat obesity as outlined per CMS and USPSTF guidelines and agreed to schedule his next follow up appointment today.  Migdalia Dk, am acting as Location manager for [employer], DO   I have reviewed the above documentation for accuracy and completeness, and I agree with the above. -Jearld Lesch, DO

## 2019-06-25 ENCOUNTER — Other Ambulatory Visit (INDEPENDENT_AMBULATORY_CARE_PROVIDER_SITE_OTHER): Payer: Self-pay | Admitting: Family Medicine

## 2019-06-25 DIAGNOSIS — E559 Vitamin D deficiency, unspecified: Secondary | ICD-10-CM

## 2019-06-25 MED FILL — VIT D2 1.25 MG (50,000 UNIT: 1.25 MG | 28 days supply | Qty: 4 | Fill #0

## 2019-06-26 ENCOUNTER — Encounter (INDEPENDENT_AMBULATORY_CARE_PROVIDER_SITE_OTHER): Payer: Self-pay

## 2019-06-26 ENCOUNTER — Other Ambulatory Visit (INDEPENDENT_AMBULATORY_CARE_PROVIDER_SITE_OTHER): Payer: Self-pay | Admitting: Family Medicine

## 2019-06-26 DIAGNOSIS — Z6832 Body mass index (BMI) 32.0-32.9, adult: Secondary | ICD-10-CM

## 2019-06-26 DIAGNOSIS — E669 Obesity, unspecified: Secondary | ICD-10-CM

## 2019-06-26 MED FILL — SAXENDA 18 MG/3 ML PEN: 18 | 30 days supply | Qty: 15 | Fill #0

## 2019-07-01 ENCOUNTER — Ambulatory Visit (INDEPENDENT_AMBULATORY_CARE_PROVIDER_SITE_OTHER): Payer: 59 | Admitting: Family Medicine

## 2019-07-15 MED FILL — ATORVASTATIN 40 MG TABLET: 40 | 30 days supply | Qty: 30 | Fill #5

## 2019-07-15 MED FILL — SILDENAFIL CITRATE 100 MG T: 100 | 30 days supply | Qty: 6 | Fill #2

## 2019-07-18 MED FILL — VENLAFAXINE HCL ER 75 MG CA: 75 | 90 days supply | Qty: 90 | Fill #0

## 2019-07-26 ENCOUNTER — Other Ambulatory Visit (INDEPENDENT_AMBULATORY_CARE_PROVIDER_SITE_OTHER): Payer: Self-pay | Admitting: Bariatrics

## 2019-07-26 DIAGNOSIS — E669 Obesity, unspecified: Secondary | ICD-10-CM

## 2019-07-29 ENCOUNTER — Encounter (INDEPENDENT_AMBULATORY_CARE_PROVIDER_SITE_OTHER): Payer: Self-pay | Admitting: Family Medicine

## 2019-07-29 ENCOUNTER — Other Ambulatory Visit: Payer: Self-pay

## 2019-07-29 ENCOUNTER — Ambulatory Visit (INDEPENDENT_AMBULATORY_CARE_PROVIDER_SITE_OTHER): Payer: 59 | Admitting: Family Medicine

## 2019-07-29 VITALS — BP 110/74 | HR 89 | Temp 98.0°F | Ht 74.0 in | Wt 243.0 lb

## 2019-07-29 DIAGNOSIS — Z9189 Other specified personal risk factors, not elsewhere classified: Secondary | ICD-10-CM | POA: Diagnosis not present

## 2019-07-29 DIAGNOSIS — R7303 Prediabetes: Secondary | ICD-10-CM | POA: Diagnosis not present

## 2019-07-29 DIAGNOSIS — E669 Obesity, unspecified: Secondary | ICD-10-CM

## 2019-07-29 DIAGNOSIS — Z6831 Body mass index (BMI) 31.0-31.9, adult: Secondary | ICD-10-CM | POA: Diagnosis not present

## 2019-07-29 DIAGNOSIS — E559 Vitamin D deficiency, unspecified: Secondary | ICD-10-CM | POA: Diagnosis not present

## 2019-07-29 MED ORDER — METFORMIN HCL 500 MG PO TABS: 500.0000 mg | ORAL_TABLET | Freq: Two times a day (BID) | ORAL | 0 refills | Status: DC

## 2019-07-29 MED ORDER — VITAMIN D (ERGOCALCIFEROL) 1.25 MG (50000 UNIT) PO CAPS: ORAL_CAPSULE | ORAL | 0 refills | Status: DC

## 2019-07-29 MED FILL — OMEPRAZOLE DR 40 MG CAPSULE: 40 | 90 days supply | Qty: 90 | Fill #2

## 2019-07-29 MED FILL — metFORMIN HCL 500 MG TABS: 500 | 30 days supply | Qty: 60 | Fill #0

## 2019-07-29 MED FILL — VIT D2 1.25 MG (50,000 UNIT: 1.25 MG | 28 days supply | Qty: 4 | Fill #0

## 2019-07-29 NOTE — Progress Notes (Signed)
Office: [phone]  /  Fax: [phone]   HPI:   Chief Complaint: OBESITY Brian Cunningham is here to discuss his progress with his obesity treatment plan. He is on the Category 4 plan and is following his eating plan approximately 40% of the time. He states he is walking 30-45 minutes 3 times per week. Brian Cunningham has done well with weight loss in the last 6 weeks even over Thanksgiving. Hunger is much improved on Saxenda at 2.4 with minimal GI upset. His weight is 243 lb (110.2 kg) today and has had a weight loss of 5 pounds over a period of 7 weeks since his last visit. He has lost 19 lbs since starting treatment with [name].  Pre-Diabetes Brian Cunningham has a diagnosis of prediabetes based on his elevated Hgb A1c and was informed this puts him at greater risk of developing diabetes. He is stable on metformin and liraglutide with decreased polyphagia. He is doing well with decreasing simple carbs and increasing lean protein and vegetables. He denies nausea or hypoglycemia.  At risk for diabetes Brian Cunningham is at higher than average risk for developing diabetes due to his obesity. He currently denies polyuria or polydipsia.  Vitamin D deficiency Brian Cunningham has a diagnosis of Vitamin D deficiency. He is stable on prescription Vit D and denies nausea, vomiting or muscle weakness. He is due for labs soon.  ASSESSMENT AND PLAN:  Vitamin D deficiency - Plan: Vitamin D, Ergocalciferol, (DRISDOL) 1.25 MG (50000 UT) CAPS capsule  Prediabetes - Plan: metFORMIN (GLUCOPHAGE) 500 MG tablet  At risk for diabetes mellitus  Class 1 obesity with serious comorbidity and body mass index (BMI) of 31.0 to 31.9 in adult, unspecified obesity type  PLAN:  Pre-Diabetes Brian Cunningham will continue to work on weight loss, exercise, and decreasing simple carbohydrates in his diet to help decrease the risk of diabetes. We dicussed metformin including benefits and risks. He was informed that eating too many simple  carbohydrates or too many calories at one sitting increases the likelihood of GI side effects. Brian Cunningham was given a refill on his metformin 500 mg BID #60 with 0 refills and agrees to follow-up with our clinic in 2-3 weeks. He will have fasting labs at his next visit.   Diabetes risk counseling Brian Cunningham was given extended (15 minutes) diabetes prevention counseling today. He is 48 y.o. male and has risk factors for diabetes including obesity. We discussed intensive lifestyle modifications today with an emphasis on weight loss as well as increasing exercise and decreasing simple carbohydrates in his diet.  Vitamin D Deficiency Brian Cunningham was informed that low Vitamin D levels contributes to fatigue and are associated with obesity, breast, and colon cancer. He agrees to continue to take prescription Vit D @ 50,000 IU every week #4 with 0 refills and will follow-up for routine testing of Vitamin D at his next visit. He was informed of the risk of over-replacement of Vitamin D and agrees to not increase his dose unless he discusses this with [name] first. Brian Cunningham agrees to follow-up with our clinic in 2 weeks.  Obesity Brian Cunningham is currently in the action stage of change. As such, his goal is to continue with weight loss efforts. He has agreed to follow the Category 4 plan. Maxie has been instructed to work up to a goal of 150 minutes of combined cardio and strengthening exercise per week for weight loss and overall health benefits. We discussed the following Behavioral Modification Strategies today: no skipping meals and holiday eating strategies.  Brian Cunningham was  given a refill on his Saxenda, pens and needles for 1 month. He agrees to follow-up with our clinic in 2-3 weeks.  Brian Cunningham has agreed to follow-up with our clinic in 2-3 weeks. He was informed of the importance of frequent follow-up visits to maximize his success with intensive lifestyle modifications for his multiple  health conditions.  ALLERGIES: No Known Allergies  MEDICATIONS: Current Outpatient Medications on File Prior to Visit  Medication Sig Dispense Refill  . amphetamine-dextroamphetamine (ADDERALL XR) 20 MG 24 hr capsule Take 20 mg by mouth daily as needed.    Marland Kitchen aspirin EC 81 MG tablet Take 81 mg by mouth daily.    Marland Kitchen atorvastatin (LIPITOR) 40 MG tablet Take 40 mg by mouth daily.    . diphenhydrAMINE (BENADRYL) 25 MG tablet Take 75 mg by mouth at bedtime.    . divalproex (DEPAKOTE ER) 500 MG 24 hr tablet Take 500 mg by mouth 2 (two) times daily.     Marland Kitchen ibuprofen (ADVIL) 200 MG tablet Take 600 mg by mouth every 6 (six) hours as needed.    . Insulin Pen Needle 32G X 4 MM MISC Use with saxenda daily 100 each 0  . levothyroxine (SYNTHROID) 88 MCG tablet Take 88 mcg by mouth daily before breakfast.    . loperamide (IMODIUM) 2 MG capsule Take 12 mg by mouth as needed for diarrhea or loose stools.    Marland Kitchen lurasidone (LATUDA) 40 MG TABS Take 40 mg by mouth every evening.    . mirtazapine (REMERON) 15 MG tablet Take 15 mg by mouth at bedtime.    . Multiple Vitamin (MULTIVITAMIN WITH MINERALS) TABS Take 1 tablet by mouth daily.    . Omega-3 Fatty Acids (FISH OIL) 1200 MG CPDR Take 1,200 mg by mouth.    Marland Kitchen omeprazole (PRILOSEC) 40 MG capsule Take 40 mg by mouth 2 (two) times daily.     Marland Kitchen SAXENDA 18 MG/3ML SOPN INJECT 3 MGS INTO THE SKIN DAILY. 15 mL 0  . sildenafil (VIAGRA) 50 MG tablet Take 50 mg by mouth as needed for erectile dysfunction.    Marland Kitchen venlafaxine XR (EFFEXOR-XR) 75 MG 24 hr capsule Take 75 mg by mouth daily.     No current facility-administered medications on file prior to visit.     PAST MEDICAL HISTORY: Past Medical History:  Diagnosis Date  . ADHD   . Ankle pain   . Back pain   . Bipolar disorder ([place])   . Esophageal spasm   . GERD (gastroesophageal reflux disease)   . High cholesterol   . Hypothyroidism   . IBS (irritable bowel syndrome)   . Insomnia     PAST SURGICAL HISTORY:  Past Surgical History:  Procedure Laterality Date  . APPENDECTOMY    . INGUINAL HERNIA REPAIR    . VASECTOMY      SOCIAL HISTORY: Social History   Tobacco Use  . Smoking status: Current Some Day Smoker    Types: Cigars  . Smokeless tobacco: Never Used  . Tobacco comment: One cigar every 2 months or so  Substance Use Topics  . Alcohol use: Yes  . Drug use: Not on file    FAMILY HISTORY: Family History  Problem Relation Age of Onset  . Thyroid disease Mother   . Cancer Mother   . Depression Mother   . Obesity Mother   . Hyperlipidemia Father   . Stroke Father    ROS: Review of Systems  Gastrointestinal: Negative for nausea and vomiting.  Musculoskeletal:       Negative for muscle weakness.  Endo/Heme/Allergies:       Negative for hypoglycemia. Positive for decreased polyphagia.   PHYSICAL EXAM: Blood pressure 110/74, pulse 89, temperature 98 F (36.7 C), temperature source Oral, height 6\' 2"  (1.88 m), weight 243 lb (110.2 kg), SpO2 98 %. Body mass index is 31.2 kg/m. Physical Exam Vitals signs reviewed.  Constitutional:      Appearance: Normal appearance. He is obese.  Cardiovascular:     Rate and Rhythm: Normal rate.     Pulses: Normal pulses.  Pulmonary:     Effort: Pulmonary effort is normal.     Breath sounds: Normal breath sounds.  Musculoskeletal: Normal range of motion.  Skin:    General: Skin is warm and dry.  Neurological:     Mental Status: He is alert and oriented to person, place, and time.  Psychiatric:        Behavior: Behavior normal.   RECENT LABS AND TESTS: BMET    Component Value Date/Time   NA 143 03/12/2019 1023   K 4.7 03/12/2019 1023   CL 100 03/12/2019 1023   CO2 26 03/12/2019 1023   GLUCOSE 97 03/12/2019 1023   BUN 16 03/12/2019 1023   CREATININE 0.92 03/12/2019 1023   CALCIUM 9.8 03/12/2019 1023   GFRNONAA 99 03/12/2019 1023   GFRAA 114 03/12/2019 1023   Lab Results  Component Value Date   HGBA1C 5.2 03/12/2019    Lab Results  Component Value Date   INSULIN 31.1 (H) 03/12/2019   CBC    Component Value Date/Time   WBC 5.4 03/12/2019 1023   RBC 4.94 03/12/2019 1023   HGB 16.9 03/12/2019 1023   HCT 50.1 03/12/2019 1023   MCV 101 (H) 03/12/2019 1023   MCH 34.2 (H) 03/12/2019 1023   MCHC 33.7 03/12/2019 1023   RDW 12.7 03/12/2019 1023   LYMPHSABS 1.4 03/12/2019 1023   EOSABS 0.2 03/12/2019 1023   BASOSABS 0.0 03/12/2019 1023   Iron/TIBC/Ferritin/ %Sat No results found for: IRON, TIBC, FERRITIN, IRONPCTSAT Lipid Panel     Component Value Date/Time   CHOL 200 (H) 03/12/2019 1023   TRIG 213 (H) 03/12/2019 1023   HDL 40 03/12/2019 1023   LDLCALC 117 (H) 03/12/2019 1023   Hepatic Function Panel     Component Value Date/Time   PROT 6.4 03/12/2019 1023   ALBUMIN 4.5 03/12/2019 1023   AST 47 (H) 03/12/2019 1023   ALT 66 (H) 03/12/2019 1023   ALKPHOS 56 03/12/2019 1023   BILITOT 0.6 03/12/2019 1023      Component Value Date/Time   TSH 2.130 03/12/2019 1023   Results for Brian Cunningham, Brian Cunningham (MRN [id number]) as of 07/29/2019 12:40  Ref. Range 03/12/2019 10:23  Vitamin D, 25-Hydroxy Latest Ref Range: 30.0 - 100.0 ng/mL 30.6   OBESITY BEHAVIORAL INTERVENTION VISIT  Today's visit was #6   Starting weight: 262 lbs Starting date: 03/12/2019 Today's weight: 243 lbs   Today's date: 07/29/2019 Total lbs lost to date: 19     07/29/2019  Height 6\' 2"  (1.88 m)  Weight 243 lb (110.2 kg)  BMI (Calculated) 31.19  BLOOD PRESSURE - SYSTOLIC [id number]  BLOOD PRESSURE - DIASTOLIC 74   Body Fat % 0000000 %  Total Body Water (lbs) 112.2 lbs   ASK: We discussed the diagnosis of obesity with Brian Cunningham today and Brian Cunningham agreed to give [name] permission to discuss obesity behavioral modification therapy today.  ASSESS: Brian Cunningham has the diagnosis of  obesity and his BMI today is 31.2. Brian Cunningham is in the action stage of change.   ADVISE: Brian Cunningham was educated on the multiple health risks  of obesity as well as the benefit of weight loss to improve his health. He was advised of the need for long term treatment and the importance of lifestyle modifications to improve his current health and to decrease his risk of future health problems.  AGREE: Multiple dietary modification options and treatment options were discussed and  Brian Cunningham agreed to follow the recommendations documented in the above note.  ARRANGE: Brian Cunningham was educated on the importance of frequent visits to treat obesity as outlined per CMS and USPSTF guidelines and agreed to schedule his next follow up appointment today.  I, Michaelene Song, am acting as Location manager for Dennard Nip, MD  I have reviewed the above documentation for accuracy and completeness, and I agree with the above. -Dennard Nip, MD

## 2019-08-02 ENCOUNTER — Other Ambulatory Visit (INDEPENDENT_AMBULATORY_CARE_PROVIDER_SITE_OTHER): Payer: Self-pay | Admitting: Bariatrics

## 2019-08-02 DIAGNOSIS — Z6832 Body mass index (BMI) 32.0-32.9, adult: Secondary | ICD-10-CM

## 2019-08-02 DIAGNOSIS — E669 Obesity, unspecified: Secondary | ICD-10-CM

## 2019-08-02 MED FILL — ADDERALL XR 20 MG CAP SA: 20 | 30 days supply | Qty: 30 | Fill #0

## 2019-08-05 ENCOUNTER — Other Ambulatory Visit (INDEPENDENT_AMBULATORY_CARE_PROVIDER_SITE_OTHER): Payer: Self-pay

## 2019-08-05 MED FILL — SAXENDA 18 MG/3 ML PEN: 18 | 30 days supply | Qty: 15 | Fill #0

## 2019-08-13 ENCOUNTER — Ambulatory Visit (INDEPENDENT_AMBULATORY_CARE_PROVIDER_SITE_OTHER): Payer: 59 | Admitting: Family Medicine

## 2019-08-13 ENCOUNTER — Other Ambulatory Visit: Payer: Self-pay

## 2019-08-13 ENCOUNTER — Encounter (INDEPENDENT_AMBULATORY_CARE_PROVIDER_SITE_OTHER): Payer: Self-pay | Admitting: Family Medicine

## 2019-08-13 VITALS — BP 103/70 | HR 74 | Temp 98.0°F | Ht 74.0 in | Wt 238.0 lb

## 2019-08-13 DIAGNOSIS — Z9189 Other specified personal risk factors, not elsewhere classified: Secondary | ICD-10-CM

## 2019-08-13 DIAGNOSIS — E559 Vitamin D deficiency, unspecified: Secondary | ICD-10-CM | POA: Diagnosis not present

## 2019-08-13 DIAGNOSIS — E7849 Other hyperlipidemia: Secondary | ICD-10-CM | POA: Diagnosis not present

## 2019-08-13 DIAGNOSIS — R7303 Prediabetes: Secondary | ICD-10-CM | POA: Diagnosis not present

## 2019-08-13 DIAGNOSIS — E038 Other specified hypothyroidism: Secondary | ICD-10-CM | POA: Diagnosis not present

## 2019-08-13 DIAGNOSIS — E669 Obesity, unspecified: Secondary | ICD-10-CM | POA: Diagnosis not present

## 2019-08-13 DIAGNOSIS — R7989 Other specified abnormal findings of blood chemistry: Secondary | ICD-10-CM | POA: Diagnosis not present

## 2019-08-13 DIAGNOSIS — Z6830 Body mass index (BMI) 30.0-30.9, adult: Secondary | ICD-10-CM

## 2019-08-13 DIAGNOSIS — E66811 Obesity, class 1: Secondary | ICD-10-CM

## 2019-08-13 MED ORDER — VITAMIN D (ERGOCALCIFEROL) 1.25 MG (50000 UNIT) PO CAPS: ORAL_CAPSULE | ORAL | 0 refills | Status: DC

## 2019-08-13 MED ORDER — METFORMIN HCL 500 MG PO TABS: 500.0000 mg | ORAL_TABLET | Freq: Two times a day (BID) | ORAL | 0 refills | Status: DC

## 2019-08-13 MED FILL — LEVOTHYROXINE 88 MCG TABLET: 88 | 90 days supply | Qty: 90 | Fill #2

## 2019-08-13 MED FILL — SILDENAFIL CITRATE 100 MG T: 100 | 30 days supply | Qty: 6 | Fill #3

## 2019-08-13 NOTE — Progress Notes (Signed)
Office: [phone]  /  Fax: [phone]   HPI:  Chief Complaint: OBESITY Brian Cunningham is here to discuss his progress with his obesity treatment plan. He is on the Category 4 plan and states he is following his eating plan approximately 50% of the time. He states he is walking the dogs and lifting at work 20-30 minutes 5-6 times per week.  Brian Cunningham continues to do well with weight loss and is tolerating Saxenda well with decreased hunger. He sometimes struggles to eat all of his protein, but notes decreased snacking.  Today's visit was #7 Starting weight: 262 lbs Starting date: 03/12/2019 Today's weight: 238 lbs  Today's date: 08/13/2019 Total lbs lost to date: 24 Total lbs lost since last in-office visit: 5  Insulin Resistance Brian Cunningham has a diagnosis of insulin resistance and is doing well on metformin, diet, and liraglutide. He is due for labs. No hypoglycemia.  At risk for diabetes Brian Cunningham is at higher than average risk for developing diabetes due to his obesity.   Vitamin D deficiency Brian Cunningham has a diagnosis of Vitamin D deficiency and is stable on prescription Vitamin D. He is due for labs. No nausea, vomiting, or muscle weakness.  Hyperlipidemia Brian Cunningham has a diagnosis of hyperlipidemia and is working on diet and weight loss. He is due for labs. No chest pain.  Elevated LFT's Brian Cunningham's AST and ALT were mildly elevated on his last labs dated 03/12/2019 (AST 47 and ALT 66). He has done well with weight loss. No abdominal pain. No jaundice.  Hypothyroidism Brian Cunningham has a diagnosis of hypothyroidism and is stable on levothyroxine. He is due to have labs rechecked, especially since losing weight.  ASSESSMENT AND PLAN:  Prediabetes - Plan: HgB A1c, Insulin, random, Comprehensive Metabolic Panel (CMET), metFORMIN (GLUCOPHAGE) 500 MG tablet  Vitamin D deficiency - Plan: Vitamin D (25 hydroxy), Comprehensive Metabolic Panel (CMET), Vitamin D,  Ergocalciferol, (DRISDOL) 1.25 MG (50000 UT) CAPS capsule  Other hyperlipidemia - Plan: Lipid Panel With LDL/HDL Ratio  Other specified hypothyroidism - Plan: T3, T4, free, TSH  Elevated LFTs - Plan: Lipid Panel With LDL/HDL Ratio  At risk for diabetes mellitus  Class 1 obesity with serious comorbidity and body mass index (BMI) of 30.0 to 30.9 in adult, unspecified obesity type  PLAN:  Insulin Resistance [name] will continue to work on weight loss, exercise, and decreasing simple carbohydrates to help decrease the risk of diabetes. [name] was given a refill on his metformin 500 mg BID #60 with 0 refills and agrees to follow-up with our clinic in 4 weeks as directed to closely monitor his progress.  Diabetes risk counseling (~15 min) [name] is a 48 y.o. male and has risk factors for diabetes including obesity. We discussed intensive lifestyle modifications today with an emphasis on weight loss as well as increasing exercise and decreasing simple carbohydrates in his diet.  Vitamin D Deficiency Brian Cunningham was informed that low Vitamin D levels contributes to fatigue and are associated with obesity, breast, and colon cancer. He agrees to continue to take prescription Vit D @ 50,000 IU every week #4 with 0 refills and will have routine testing of Vitamin D. He was informed of the risk of over-replacement of Vitamin D and agrees to not increase his dose unless he discusses this with [name] first. Brian Cunningham agrees to follow-up with our clinic in 4 weeks.  Hyperlipidemia Intensive lifestyle modifications as the first line treatment for hyperlipidemia. We discussed many lifestyle modifications today and Brian Cunningham will have labs done, continue to  work on diet, exercise and weight loss efforts. He will follow-up as directed.  Elevated LFT's Brian Cunningham will have labs checked and will continue diet and exercise.  Hypothyroidism Brian Cunningham was informed of the importance of good  thyroid control for overall health and that supertheraputic thyroid levels are dangerous and will not improve weight loss results. He will have labs done and follow-up as directed.  Obesity Brian Cunningham is currently in the action stage of change. As such, his goal is to continue with weight loss efforts. He has agreed to follow the Category 4 plan. Brian Cunningham will work up to a goal of 150 minutes of combined cardio and strengthening exercise per week for weight loss and overall health benefits. We discussed the following Behavioral Modification Strategies today: increasing lean protein intake, work on meal planning and easy cooking plans, and holiday eating strategies.   Brian Cunningham will continue Saxenda 2.4 and was given a refill on his pen needles.  Brian Cunningham has agreed to follow-up with our clinic in 4 weeks. He was informed of the importance of frequent follow-up visits to maximize his success with intensive lifestyle modifications for his multiple health conditions.  ALLERGIES: No Known Allergies  MEDICATIONS: Current Outpatient Medications on File Prior to Visit  Medication Sig Dispense Refill  . amphetamine-dextroamphetamine (ADDERALL XR) 20 MG 24 hr capsule Take 20 mg by mouth daily as needed.    Marland Kitchen aspirin EC 81 MG tablet Take 81 mg by mouth daily.    Marland Kitchen atorvastatin (LIPITOR) 40 MG tablet Take 40 mg by mouth daily.    . diphenhydrAMINE (BENADRYL) 25 MG tablet Take 75 mg by mouth at bedtime.    . divalproex (DEPAKOTE ER) 500 MG 24 hr tablet Take 500 mg by mouth 2 (two) times daily.     Marland Kitchen ibuprofen (ADVIL) 200 MG tablet Take 600 mg by mouth every 6 (six) hours as needed.    . Insulin Pen Needle 32G X 4 MM MISC Use with saxenda daily 100 each 0  . levothyroxine (SYNTHROID) 88 MCG tablet Take 88 mcg by mouth daily before breakfast.    . loperamide (IMODIUM) 2 MG capsule Take 12 mg by mouth as needed for diarrhea or loose stools.    Marland Kitchen lurasidone (LATUDA) 40 MG TABS Take 40 mg by  mouth every evening.    . mirtazapine (REMERON) 15 MG tablet Take 15 mg by mouth at bedtime.    . Multiple Vitamin (MULTIVITAMIN WITH MINERALS) TABS Take 1 tablet by mouth daily.    . Omega-3 Fatty Acids (FISH OIL) 1200 MG CPDR Take 1,200 mg by mouth.    Marland Kitchen omeprazole (PRILOSEC) 40 MG capsule Take 40 mg by mouth 2 (two) times daily.     Marland Kitchen SAXENDA 18 MG/3ML SOPN INJECT 3 MGS INTO THE SKIN DAILY. 15 mL 0  . sildenafil (VIAGRA) 50 MG tablet Take 50 mg by mouth as needed for erectile dysfunction.    Marland Kitchen venlafaxine XR (EFFEXOR-XR) 75 MG 24 hr capsule Take 75 mg by mouth daily.     No current facility-administered medications on file prior to visit.    PAST MEDICAL HISTORY: Past Medical History:  Diagnosis Date  . ADHD   . Ankle pain   . Back pain   . Bipolar disorder ([place])   . Esophageal spasm   . GERD (gastroesophageal reflux disease)   . High cholesterol   . Hypothyroidism   . IBS (irritable bowel syndrome)   . Insomnia     PAST SURGICAL HISTORY: Past  Surgical History:  Procedure Laterality Date  . APPENDECTOMY    . INGUINAL HERNIA REPAIR    . VASECTOMY      SOCIAL HISTORY: Social History   Tobacco Use  . Smoking status: Current Some Day Smoker    Types: Cigars  . Smokeless tobacco: Never Used  . Tobacco comment: One cigar every 2 months or so  Substance Use Topics  . Alcohol use: Yes  . Drug use: Not on file    FAMILY HISTORY: Family History  Problem Relation Age of Onset  . Thyroid disease Mother   . Cancer Mother   . Depression Mother   . Obesity Mother   . Hyperlipidemia Father   . Stroke Father    ROS: Review of Systems  Constitutional: Positive for weight loss (5 lbs).  Cardiovascular: Negative for chest pain.  Gastrointestinal: Negative for abdominal pain, nausea and vomiting.  Musculoskeletal:       Negative for muscle weakness.  Skin:       Negative for jaundice.  Endo/Heme/Allergies:       Negative for hypoglycemia.   PHYSICAL EXAM: Blood  pressure 103/70, pulse 74, temperature 98 F (36.7 C), temperature source Oral, height 6\' 2"  (1.88 m), weight 238 lb (108 kg), SpO2 95 %. Body mass index is 30.56 kg/m. Physical Exam Vitals reviewed.  Constitutional:      Appearance: Normal appearance. He is obese.  Cardiovascular:     Rate and Rhythm: Normal rate.     Pulses: Normal pulses.  Pulmonary:     Effort: Pulmonary effort is normal.     Breath sounds: Normal breath sounds.  Musculoskeletal:        General: Normal range of motion.  Skin:    General: Skin is warm and dry.  Neurological:     Mental Status: He is alert and oriented to person, place, and time.  Psychiatric:        Behavior: Behavior normal.   RECENT LABS AND TESTS: BMET    Component Value Date/Time   NA 143 03/12/2019 1023   K 4.7 03/12/2019 1023   CL 100 03/12/2019 1023   CO2 26 03/12/2019 1023   GLUCOSE 97 03/12/2019 1023   BUN 16 03/12/2019 1023   CREATININE 0.92 03/12/2019 1023   CALCIUM 9.8 03/12/2019 1023   GFRNONAA 99 03/12/2019 1023   GFRAA 114 03/12/2019 1023   Lab Results  Component Value Date   HGBA1C 5.2 03/12/2019   Lab Results  Component Value Date   INSULIN 31.1 (H) 03/12/2019   CBC    Component Value Date/Time   WBC 5.4 03/12/2019 1023   RBC 4.94 03/12/2019 1023   HGB 16.9 03/12/2019 1023   HCT 50.1 03/12/2019 1023   MCV 101 (H) 03/12/2019 1023   MCH 34.2 (H) 03/12/2019 1023   MCHC 33.7 03/12/2019 1023   RDW 12.7 03/12/2019 1023   LYMPHSABS 1.4 03/12/2019 1023   EOSABS 0.2 03/12/2019 1023   BASOSABS 0.0 03/12/2019 1023   Iron/TIBC/Ferritin/ %Sat No results found for: IRON, TIBC, FERRITIN, IRONPCTSAT Lipid Panel     Component Value Date/Time   CHOL 200 (H) 03/12/2019 1023   TRIG 213 (H) 03/12/2019 1023   HDL 40 03/12/2019 1023   LDLCALC 117 (H) 03/12/2019 1023   Hepatic Function Panel     Component Value Date/Time   PROT 6.4 03/12/2019 1023   ALBUMIN 4.5 03/12/2019 1023   AST 47 (H) 03/12/2019 1023   ALT  66 (H) 03/12/2019 1023  ALKPHOS 56 03/12/2019 1023   BILITOT 0.6 03/12/2019 1023      Component Value Date/Time   TSH 2.130 03/12/2019 1023      I, Michaelene Song, am acting as Location manager for Dennard Nip, MD I have reviewed the above documentation for accuracy and completeness, and I agree with the above. -Dennard Nip, MD

## 2019-08-14 LAB — COMPREHENSIVE METABOLIC PANEL
ALT: 62 IU/L — ABNORMAL HIGH (ref 0–44)
AST: 34 IU/L (ref 0–40)
Albumin/Globulin Ratio: 2.2 (ref 1.2–2.2)
Albumin: 4.4 g/dL (ref 4.0–5.0)
Alkaline Phosphatase: 59 IU/L (ref 39–117)
BUN/Creatinine Ratio: 16 (ref 9–20)
BUN: 15 mg/dL (ref 6–24)
Bilirubin Total: 0.7 mg/dL (ref 0.0–1.2)
CO2: 27 mmol/L (ref 20–29)
Calcium: 9.8 mg/dL (ref 8.7–10.2)
Chloride: 98 mmol/L (ref 96–106)
Creatinine, Ser: 0.95 mg/dL (ref 0.76–1.27)
GFR calc Af Amer: 109 mL/min/{1.73_m2} (ref >59)
GFR calc non Af Amer: 94 mL/min/{1.73_m2} (ref >59)
Globulin, Total: 2 g/dL (ref 1.5–4.5)
Glucose: 90 mg/dL (ref 65–99)
Potassium: 4.6 mmol/L (ref 3.5–5.2)
Sodium: 142 mmol/L (ref 134–144)
Total Protein: 6.4 g/dL (ref 6.0–8.5)

## 2019-08-14 LAB — LIPID PANEL WITH LDL/HDL RATIO
Cholesterol, Total: 150 mg/dL (ref 100–199)
HDL: 47 mg/dL (ref >39)
LDL Chol Calc (NIH): 89 mg/dL (ref 0–99)
LDL/HDL Ratio: 1.9 ratio (ref 0.0–3.6)
Triglycerides: 72 mg/dL (ref 0–149)
VLDL Cholesterol Cal: 14 mg/dL (ref 5–40)

## 2019-08-14 LAB — T4, FREE: Free T4: 1.78 ng/dL — ABNORMAL HIGH (ref 0.82–1.77)

## 2019-08-14 LAB — HEMOGLOBIN A1C
Est. average glucose Bld gHb Est-mCnc: 91 mg/dL
Hgb A1c MFr Bld: 4.8 % (ref 4.8–5.6)

## 2019-08-14 LAB — T3: T3, Total: 109 ng/dL (ref 71–180)

## 2019-08-14 LAB — VITAMIN D 25 HYDROXY (VIT D DEFICIENCY, FRACTURES): Vit D, 25-Hydroxy: 66.5 ng/mL (ref 30.0–100.0)

## 2019-08-14 LAB — INSULIN, RANDOM: INSULIN: 18.1 u[IU]/mL (ref 2.6–24.9)

## 2019-08-14 LAB — TSH: TSH: 1.99 u[IU]/mL (ref 0.450–4.500)

## 2019-08-14 MED FILL — ATORVASTATIN 40 MG TABLET: 40 | 90 days supply | Qty: 90 | Fill #0

## 2019-09-02 MED FILL — DIVALPROEX SOD ER 500 MG TA: 500 | 90 days supply | Qty: 180 | Fill #0

## 2019-09-02 MED FILL — metFORMIN HCL 500 MG TABS: 500 | 30 days supply | Qty: 60 | Fill #0

## 2019-09-02 MED FILL — traZODone HCL 100 MG TABS: 100 | 90 days supply | Qty: 90 | Fill #0

## 2019-09-02 MED FILL — VIT D2 1.25 MG (50,000 UNIT: 1.25 MG | 28 days supply | Qty: 4 | Fill #0

## 2019-09-04 DIAGNOSIS — F902 Attention-deficit hyperactivity disorder, combined type: Secondary | ICD-10-CM | POA: Diagnosis not present

## 2019-09-04 DIAGNOSIS — G4700 Insomnia, unspecified: Secondary | ICD-10-CM | POA: Diagnosis not present

## 2019-09-04 DIAGNOSIS — F3132 Bipolar disorder, current episode depressed, moderate: Secondary | ICD-10-CM | POA: Diagnosis not present

## 2019-09-04 MED FILL — ADDERALL XR 20 MG CAP SA: 20 | 30 days supply | Qty: 30 | Fill #0

## 2019-09-04 MED FILL — LATUDA 40 MG TABLET: 40 | 90 days supply | Qty: 90 | Fill #0

## 2019-09-04 MED FILL — MIRTAZAPINE 15 MG TABLET: 15 | 90 days supply | Qty: 90 | Fill #0

## 2019-09-06 ENCOUNTER — Other Ambulatory Visit (INDEPENDENT_AMBULATORY_CARE_PROVIDER_SITE_OTHER): Payer: Self-pay | Admitting: Family Medicine

## 2019-09-06 DIAGNOSIS — E669 Obesity, unspecified: Secondary | ICD-10-CM

## 2019-09-09 MED FILL — UNIFINE PENTIPS 32GX5/32: 32G X 4 MM | 90 days supply | Qty: 100 | Fill #0

## 2019-09-10 ENCOUNTER — Telehealth (INDEPENDENT_AMBULATORY_CARE_PROVIDER_SITE_OTHER): Payer: 59 | Admitting: Family Medicine

## 2019-09-10 ENCOUNTER — Encounter (INDEPENDENT_AMBULATORY_CARE_PROVIDER_SITE_OTHER): Payer: Self-pay | Admitting: Family Medicine

## 2019-09-10 ENCOUNTER — Other Ambulatory Visit: Payer: Self-pay

## 2019-09-10 DIAGNOSIS — E8881 Metabolic syndrome: Secondary | ICD-10-CM | POA: Diagnosis not present

## 2019-09-10 DIAGNOSIS — Z6830 Body mass index (BMI) 30.0-30.9, adult: Secondary | ICD-10-CM | POA: Diagnosis not present

## 2019-09-10 DIAGNOSIS — E7849 Other hyperlipidemia: Secondary | ICD-10-CM

## 2019-09-10 DIAGNOSIS — E88819 Insulin resistance, unspecified: Secondary | ICD-10-CM

## 2019-09-10 DIAGNOSIS — E669 Obesity, unspecified: Secondary | ICD-10-CM | POA: Diagnosis not present

## 2019-09-10 DIAGNOSIS — E66811 Obesity, class 1: Secondary | ICD-10-CM

## 2019-09-10 MED ORDER — METFORMIN HCL 500 MG PO TABS: 500.0000 mg | ORAL_TABLET | Freq: Two times a day (BID) | ORAL | 0 refills | Status: DC

## 2019-09-10 MED ORDER — SAXENDA 18 MG/3ML ~~LOC~~ SOPN: 3.0000 mg | PEN_INJECTOR | Freq: Every day | SUBCUTANEOUS | 0 refills | Status: DC

## 2019-09-10 MED ORDER — UNIFINE PENTIPS 32G X 4 MM MISC: 0 refills | Status: DC

## 2019-09-10 MED FILL — SAXENDA 18 MG/3 ML PEN: 18 | 30 days supply | Qty: 15 | Fill #0

## 2019-09-11 MED FILL — SILDENAFIL CITRATE 100 MG T: 100 | 30 days supply | Qty: 6 | Fill #4

## 2019-09-11 NOTE — Progress Notes (Signed)
TeleHealth Visit:  Due to the COVID-19 pandemic, this visit was completed with telemedicine (audio/video) technology to reduce patient and provider exposure as well as to preserve personal protective equipment.   Brian Cunningham has verbally consented to this TeleHealth visit. The patient is located at home, the provider is located at the [facility] office. The participants in this visit include the listed provider, patient, and his spouse [place]. The visit was conducted today via Face Time.  Chief Complaint: OBESITY Brian Cunningham is here to discuss his progress with his obesity treatment plan along with follow-up of his obesity related diagnoses. [name] is on the Category 4 Plan and states he is following his eating plan approximately 50% of the time. Algot states he is walking the dog 30 minutes 4 times per week.  Today's visit was #: 8 Starting weight: 262 lbs Starting date: 03/12/19  Interim History: Brian Cunningham feels that he has done well avoiding holiday weight gain even with extra celebrations. He feels his hunger is controlled and is doing well with food choices. Brian Cunningham is tolerating Saxenda well.  Subjective:   1. Insulin Resistance [name] has a diagnosis of insulin resistance based on his elevated fasting insulin level >5 and A1c, which are improved. [name] is stable on metformin and Saxenda. He continues to work on diet and exercise to decrease his risk of diabetes.   Lab Results  Component Value Date   INSULIN 18.1 08/13/2019   INSULIN 31.1 (H) 03/12/2019   2. Other hyperlipidemia Labs were discussed today. [name] has hyperlipidemia and his HDL, LDL, and triglycerides have all improved nicely with diet, exercise, and weight loss. He has been trying to improve his cholesterol levels with intensive lifestyle modification including a low saturated fat diet, exercise, and weight loss. He denies any chest pain.  Lab Results   Component Value Date   ALT 62 (H) 08/13/2019   AST 34 08/13/2019   ALKPHOS 59 08/13/2019   BILITOT 0.7 08/13/2019   Lab Results  Component Value Date   CHOL 150 08/13/2019   HDL 47 08/13/2019   LDLCALC 89 08/13/2019   TRIG 72 08/13/2019    Assessment/Plan:   1. Insulin Resistance Brian Cunningham will continue to take Saxenda and metformin. He will work on weight loss, exercise, and decreasing simple carbohydrates to help decrease the risk of diabetes. Brian Cunningham agreed to follow-up with [name] as directed to closely monitor his progress.  - metFORMIN (GLUCOPHAGE) 500 MG tablet; Take 1 tablet (500 mg total) by mouth 2 (two) times daily with a meal.  Dispense: 60 tablet; Refill: 0  2. Other hyperlipidemia Cardiovascular risk and specific lipid/LDL goals reviewed.  We discussed several lifestyle modifications today and [name] will continue to work on diet, exercise, and weight loss efforts. Orders and follow up as documented in patient record.   Counseling Intensive lifestyle modifications are the first line treatment for this issue. . Dietary changes: Increase soluble fiber. Decrease simple carbohydrates. . Exercise changes: Moderate to vigorous-intensity aerobic activity 150 minutes per week if tolerated. . Lipid-lowering medications: see documented in medical record.   3. Class 1 obesity with serious comorbidity and body mass index (BMI) of 30.0 to 30.9 in adult, unspecified obesity type Brian Nip, MD  - Liraglutide -Weight Management (SAXENDA) 18 MG/3ML SOPN; Inject 3 mg as directed daily.  Dispense: 5 pen; Refill: 0 - Insulin Pen Needle (UNIFINE PENTIPS) 32G X 4 MM MISC; USE WITH SAXENDA DAILY  Dispense: 100 each; Refill: 0  Brian Cunningham  is currently in the action stage of change. As such, his goal is to continue with weight loss efforts. He has agreed to the Category 4 Plan.    We discussed the following behavioral modification strategies today: increasing lean protein  intake and holiday eating strategies .  Brian Cunningham has agreed to follow-up with our clinic in 3 to 4 weeks. He was informed of the importance of frequent follow-up visits to maximize his success with intensive lifestyle modifications for his multiple health conditions.  Objective:   VITALS: Per patient if applicable, see vitals. GENERAL: Alert and in no acute distress. CARDIOPULMONARY: No increased WOB. Speaking in clear sentences.  PSYCH: Pleasant and cooperative. Speech normal rate and rhythm. Affect is appropriate. Insight and judgement are appropriate. Attention is focused, linear, and appropriate.  NEURO: Oriented as arrived to appointment on time with no prompting.   Lab Results  Component Value Date   CREATININE 0.95 08/13/2019   BUN 15 08/13/2019   NA 142 08/13/2019   K 4.6 08/13/2019   CL 98 08/13/2019   CO2 27 08/13/2019   Lab Results  Component Value Date   ALT 62 (H) 08/13/2019   AST 34 08/13/2019   ALKPHOS 59 08/13/2019   BILITOT 0.7 08/13/2019   Lab Results  Component Value Date   HGBA1C 4.8 08/13/2019   HGBA1C 5.2 03/12/2019   Lab Results  Component Value Date   INSULIN 18.1 08/13/2019   INSULIN 31.1 (H) 03/12/2019   Lab Results  Component Value Date   TSH 1.990 08/13/2019   Lab Results  Component Value Date   CHOL 150 08/13/2019   HDL 47 08/13/2019   LDLCALC 89 08/13/2019   TRIG 72 08/13/2019   Lab Results  Component Value Date   WBC 5.4 03/12/2019   HGB 16.9 03/12/2019   HCT 50.1 03/12/2019   MCV 101 (H) 03/12/2019   No results found for: IRON, TIBC, FERRITIN  Attestation Statements:   Reviewed by clinician on day of visit: allergies, medications, problem list, medical history, surgical history, family history, social history, and previous encounter notes.  IMarcille Blanco, CMA, am acting as transcriptionist for Starlyn Skeans, MD  I have reviewed the above documentation for accuracy and completeness, and I agree with the  above. - Brian Nip, MD

## 2019-09-18 MED FILL — UNIFINE PENTIPS 32GX5/32: 32G X 4 MM | 90 days supply | Qty: 100 | Fill #0

## 2019-10-03 ENCOUNTER — Other Ambulatory Visit (INDEPENDENT_AMBULATORY_CARE_PROVIDER_SITE_OTHER): Payer: Self-pay | Admitting: Family Medicine

## 2019-10-03 DIAGNOSIS — E559 Vitamin D deficiency, unspecified: Secondary | ICD-10-CM

## 2019-10-03 MED FILL — UNIFINE PENTIPS 32GX5/32": 32G X 4 MM | 90 days supply | Qty: 100 | Fill #0

## 2019-10-03 MED FILL — UNIFINE PENTIPS 32GX5/32: 32G X 4 MM | 90 days supply | Qty: 100 | Fill #0

## 2019-10-03 MED FILL — metFORMIN HCL 500 MG TABS: 500 | 30 days supply | Qty: 60 | Fill #0

## 2019-10-04 MED FILL — ADDERALL XR 20 MG CAP SA: 20 | 30 days supply | Qty: 30 | Fill #0

## 2019-10-07 ENCOUNTER — Encounter (INDEPENDENT_AMBULATORY_CARE_PROVIDER_SITE_OTHER): Payer: Self-pay | Admitting: Family Medicine

## 2019-10-07 ENCOUNTER — Telehealth (INDEPENDENT_AMBULATORY_CARE_PROVIDER_SITE_OTHER): Payer: 59 | Admitting: Family Medicine

## 2019-10-07 ENCOUNTER — Other Ambulatory Visit: Payer: Self-pay

## 2019-10-07 ENCOUNTER — Other Ambulatory Visit (INDEPENDENT_AMBULATORY_CARE_PROVIDER_SITE_OTHER): Payer: Self-pay | Admitting: Family Medicine

## 2019-10-07 DIAGNOSIS — E559 Vitamin D deficiency, unspecified: Secondary | ICD-10-CM | POA: Diagnosis not present

## 2019-10-07 DIAGNOSIS — Z6830 Body mass index (BMI) 30.0-30.9, adult: Secondary | ICD-10-CM | POA: Diagnosis not present

## 2019-10-07 DIAGNOSIS — E669 Obesity, unspecified: Secondary | ICD-10-CM

## 2019-10-07 DIAGNOSIS — E8881 Metabolic syndrome: Secondary | ICD-10-CM

## 2019-10-07 MED ORDER — VITAMIN D (ERGOCALCIFEROL) 1.25 MG (50000 UNIT) PO CAPS: ORAL_CAPSULE | ORAL | 0 refills | Status: DC

## 2019-10-07 MED ORDER — METFORMIN HCL 500 MG PO TABS: 500.0000 mg | ORAL_TABLET | Freq: Two times a day (BID) | ORAL | 0 refills | Status: DC

## 2019-10-07 MED FILL — VIT D2 1.25 MG (50,000 UNIT: 1.25 MG | 28 days supply | Qty: 4 | Fill #0

## 2019-10-08 NOTE — Progress Notes (Signed)
TeleHealth Visit:  Due to the COVID-19 pandemic, this visit was completed with telemedicine (audio/video) technology to reduce patient and provider exposure as well as to preserve personal protective equipment.   Brian Cunningham has verbally consented to this TeleHealth visit. The patient is located at home, the provider is located at the [facility] office. The participants in this visit include the listed provider and patient. The visit was conducted today via face time.   Chief Complaint: OBESITY Brian Cunningham is here to discuss his progress with his obesity treatment plan along with follow-up of his obesity related diagnoses. Brian Cunningham is on the Category 4 Plan and states he is following his eating plan approximately 50% of the time. Brian Cunningham states he is walking 10,000 steps 4 times per week.  Today's visit was #: 9 Starting weight: 262 lbs Starting date: 03/12/2019  Interim History: Brian Cunningham feels he continues to do well with weight loss and his weight at home was approximately 235 lbs. He is on Saxenda at 2.4 mg, and he is tolerating this dose well and he feels his hunger is well controlled.  Subjective:   1. Vitamin D deficiency Brian Cunningham is stable on Vit D, and last Vit D level was at goal. He requests a refill today.  2. Insulin resistance Brian Cunningham is stable on metformin and Saxenda. He denies episodes of feeling hypoglycemia or GI issues.  Assessment/Plan:   1. Vitamin D deficiency Low Vitamin D level contributes to fatigue and are associated with obesity, breast, and colon cancer. We will refill prescription Vitamin D for 1 month. Brian Cunningham will follow-up for routine testing of Vitamin D, at least 2-3 times per year to avoid over-replacement. We will recheck labs in 1 month.  - Vitamin D, Ergocalciferol, (DRISDOL) 1.25 MG (50000 UNIT) CAPS capsule; TAKE 1 CAPSULE BY MOUTH EVERY 7 DAYS.  Dispense: 4 capsule; Refill: 0  2. Insulin  resistance Brian Cunningham will continue his diet prescription, and will continue to work on weight loss, exercise, and decreasing simple carbohydrates to help decrease the risk of diabetes. We will refill metformin for 1 month, and Brian Cunningham agreed to continue Saxenda. We will recheck labs in 1 month. Brian Cunningham agreed to follow-up with [name] as directed to closely monitor his progress.  - metFORMIN (GLUCOPHAGE) 500 MG tablet; Take 1 tablet (500 mg total) by mouth 2 (two) times daily with a meal.  Dispense: 60 tablet; Refill: 0  3. Class 1 obesity with serious comorbidity and body mass index (BMI) of 30.0 to 30.9 in adult, unspecified obesity type Brian Cunningham is currently in the action stage of change. As such, his goal is to continue with weight loss efforts. He has agreed to the Category 4 Plan.   We discussed various medication options to help Brian Cunningham with his weight loss efforts and we both agreed to continue Saxenda, and we will refill Saxenda for 1 month..  Behavioral modification strategies: meal planning and cooking strategies.  Brian Cunningham has agreed to follow-up with our clinic in 3 weeks with Brian Potash, PA-C. He was informed of the importance of frequent follow-up visits to maximize his success with intensive lifestyle modifications for his multiple health conditions.  Objective:   VITALS: Per patient if applicable, see vitals. GENERAL: Alert and in no acute distress. CARDIOPULMONARY: No increased WOB. Speaking in clear sentences.  PSYCH: Pleasant and cooperative. Speech normal rate and rhythm. Affect is appropriate. Insight and judgement are appropriate. Attention is focused, linear, and appropriate.  NEURO: Oriented as arrived to appointment on  time with no prompting.   Lab Results  Component Value Date   CREATININE 0.95 08/13/2019   BUN 15 08/13/2019   NA 142 08/13/2019   K 4.6 08/13/2019   CL 98 08/13/2019   CO2 27 08/13/2019   Lab Results  Component Value Date    ALT 62 (H) 08/13/2019   AST 34 08/13/2019   ALKPHOS 59 08/13/2019   BILITOT 0.7 08/13/2019   Lab Results  Component Value Date   HGBA1C 4.8 08/13/2019   HGBA1C 5.2 03/12/2019   Lab Results  Component Value Date   INSULIN 18.1 08/13/2019   INSULIN 31.1 (H) 03/12/2019   Lab Results  Component Value Date   TSH 1.990 08/13/2019   Lab Results  Component Value Date   CHOL 150 08/13/2019   HDL 47 08/13/2019   LDLCALC 89 08/13/2019   TRIG 72 08/13/2019   Lab Results  Component Value Date   WBC 5.4 03/12/2019   HGB 16.9 03/12/2019   HCT 50.1 03/12/2019   MCV 101 (H) 03/12/2019   No results found for: IRON, TIBC, FERRITIN  Attestation Statements:   Reviewed by clinician on day of visit: allergies, medications, problem list, medical history, surgical history, family history, social history, and previous encounter notes.   I, Trixie Dredge, am acting as transcriptionist for Dennard Nip, MD.  I have reviewed the above documentation for accuracy and completeness, and I agree with the above. - Dennard Nip, MD

## 2019-10-10 MED ORDER — SAXENDA 18 MG/3ML ~~LOC~~ SOPN: 3.0000 mg | PEN_INJECTOR | Freq: Every day | SUBCUTANEOUS | 0 refills | Status: DC

## 2019-10-14 MED FILL — VENLAFAXINE HCL ER 75 MG CA: 75 | 90 days supply | Qty: 90 | Fill #0

## 2019-10-15 MED FILL — SILDENAFIL CITRATE 100 MG T: 100 | 30 days supply | Qty: 6 | Fill #0

## 2019-10-22 MED FILL — OMEPRAZOLE DR 40 MG CAPSULE: 40 | 90 days supply | Qty: 90 | Fill #3

## 2019-10-23 MED FILL — SAXENDA 18 MG/3 ML PEN: 18 | 30 days supply | Qty: 15 | Fill #0

## 2019-10-28 ENCOUNTER — Other Ambulatory Visit (INDEPENDENT_AMBULATORY_CARE_PROVIDER_SITE_OTHER): Payer: Self-pay | Admitting: Family Medicine

## 2019-10-28 DIAGNOSIS — E559 Vitamin D deficiency, unspecified: Secondary | ICD-10-CM

## 2019-10-28 MED FILL — metFORMIN HCL 500 MG TABS: 500 | 30 days supply | Qty: 60 | Fill #0

## 2019-10-31 ENCOUNTER — Other Ambulatory Visit (INDEPENDENT_AMBULATORY_CARE_PROVIDER_SITE_OTHER): Payer: Self-pay | Admitting: Family Medicine

## 2019-10-31 ENCOUNTER — Telehealth (INDEPENDENT_AMBULATORY_CARE_PROVIDER_SITE_OTHER): Payer: 59 | Admitting: Physician Assistant

## 2019-10-31 ENCOUNTER — Other Ambulatory Visit: Payer: Self-pay

## 2019-10-31 ENCOUNTER — Encounter (INDEPENDENT_AMBULATORY_CARE_PROVIDER_SITE_OTHER): Payer: Self-pay | Admitting: Physician Assistant

## 2019-10-31 DIAGNOSIS — Z6830 Body mass index (BMI) 30.0-30.9, adult: Secondary | ICD-10-CM

## 2019-10-31 DIAGNOSIS — E559 Vitamin D deficiency, unspecified: Secondary | ICD-10-CM

## 2019-10-31 DIAGNOSIS — E8881 Metabolic syndrome: Secondary | ICD-10-CM

## 2019-10-31 DIAGNOSIS — E669 Obesity, unspecified: Secondary | ICD-10-CM

## 2019-10-31 DIAGNOSIS — F902 Attention-deficit hyperactivity disorder, combined type: Secondary | ICD-10-CM | POA: Diagnosis not present

## 2019-10-31 DIAGNOSIS — G4700 Insomnia, unspecified: Secondary | ICD-10-CM | POA: Diagnosis not present

## 2019-10-31 DIAGNOSIS — F3132 Bipolar disorder, current episode depressed, moderate: Secondary | ICD-10-CM | POA: Diagnosis not present

## 2019-10-31 MED ORDER — VITAMIN D (ERGOCALCIFEROL) 1.25 MG (50000 UNIT) PO CAPS: ORAL_CAPSULE | ORAL | 0 refills | Status: DC

## 2019-10-31 MED ORDER — METFORMIN HCL 500 MG PO TABS: 500.0000 mg | ORAL_TABLET | Freq: Two times a day (BID) | ORAL | 0 refills | Status: DC

## 2019-10-31 MED FILL — VIT D2 1.25 MG (50,000 UNIT: 1.25 MG | 28 days supply | Qty: 4 | Fill #0

## 2019-10-31 NOTE — Progress Notes (Signed)
TeleHealth Visit:  Due to the COVID-19 pandemic, this visit was completed with telemedicine (audio/video) technology to reduce patient and provider exposure as well as to preserve personal protective equipment.   Brian Cunningham has verbally consented to this TeleHealth visit. The patient is located at home, the provider is located at the [facility] office. The participants in this visit include the listed provider and patient. The visit was conducted today via FaceTime.  Chief Complaint: OBESITY Brian Cunningham is here to discuss his Cunningham with his obesity treatment plan along with follow-up of his obesity related diagnoses. Brian Cunningham is on the Category 4 Plan and states he is following his eating plan approximately 40% of the time. Brian Cunningham states he is increasing his steps at work 6 days per week and strength training 20 minutes 2 times per week.  Today's visit was #: 10 Starting weight: 262 lbs Starting date: 03/12/2019  Interim History: Brian Cunningham most recent weight was 234 lbs. He reports having a "reward meal" after finishing his 4 day work week and wants to discuss that today. He has been journaling intermittently.  Subjective:   Vitamin D deficiency. Brian Cunningham is on prescription Vitamin D weekly. No nausea, vomiting, or muscle weakness. He is at risk for over replacement of Vitamin D. He is due for labs next visit. Last Vitamin D 66.5 on 08/13/2019.  Insulin resistance. Brian Cunningham has a diagnosis of insulin resistance based on his elevated fasting insulin level >5. He continues to work on diet and exercise to decrease his risk of diabetes. Brian Cunningham is on metformin. No nausea, vomiting, or diarrhea. No cravings. He is due for labs next visit.  Lab Results  Component Value Date   INSULIN 18.1 08/13/2019   INSULIN 31.1 (H) 03/12/2019   Lab Results  Component Value Date   HGBA1C 4.8 08/13/2019   Assessment/Plan:   Vitamin D deficiency. Low  Vitamin D level contributes to fatigue and are associated with obesity, breast, and colon cancer. He was given a refill on his Vitamin D, Ergocalciferol, (DRISDOL) 1.25 MG (50000 UNIT) CAPS capsule every week #4 with 0 refills and will follow-up for routine testing of Vitamin D, at least 2-3 times per year to avoid over-replacement.    Insulin resistance. Brian Cunningham will continue to work on weight loss, exercise, and decreasing simple carbohydrates to help decrease the risk of diabetes. Brian Cunningham. He was given a refill on his metFORMIN (GLUCOPHAGE) 500 MG tablet #60 with 0 refills.  Class 1 obesity with serious comorbidity and body mass index (BMI) of 30.0 to 30.9 in adult, unspecified obesity type.  Brian Cunningham. As such, his goal is to continue with weight loss efforts. He has agreed to the Category 4 Plan and journal 1800 calories + 115 grams of protein daily.   Exercise goals: For substantial health benefits, adults should do at least 150 minutes (2 hours and 30 minutes) a week of moderate-intensity, or 75 minutes (1 hour and 15 minutes) a week of vigorous-intensity aerobic physical activity, or an equivalent combination of moderate- and vigorous-intensity aerobic activity. Aerobic activity should be performed in episodes of at least 10 minutes, and preferably, it should be spread throughout the week.  Behavioral modification strategies: meal planning and cooking strategies and keeping healthy foods in the home.  Brian Cunningham has agreed to follow-up with our clinic in 4 weeks. He was informed of the importance of  frequent follow-up visits to maximize his success with intensive lifestyle modifications for his multiple health conditions.  Objective:   VITALS: Per patient if applicable, see vitals. GENERAL: Alert and in no acute distress. CARDIOPULMONARY: No increased WOB. Speaking  in clear sentences.  PSYCH: Pleasant and cooperative. Speech normal rate and rhythm. Affect is appropriate. Insight and judgement are appropriate. Attention is focused, linear, and appropriate.  NEURO: Oriented as arrived to appointment on time with no prompting.   Lab Results  Component Value Date   CREATININE 0.95 08/13/2019   BUN 15 08/13/2019   NA 142 08/13/2019   K 4.6 08/13/2019   CL 98 08/13/2019   CO2 27 08/13/2019   Lab Results  Component Value Date   ALT 62 (H) 08/13/2019   AST 34 08/13/2019   ALKPHOS 59 08/13/2019   BILITOT 0.7 08/13/2019   Lab Results  Component Value Date   HGBA1C 4.8 08/13/2019   HGBA1C 5.2 03/12/2019   Lab Results  Component Value Date   INSULIN 18.1 08/13/2019   INSULIN 31.1 (H) 03/12/2019   Lab Results  Component Value Date   TSH 1.990 08/13/2019   Lab Results  Component Value Date   CHOL 150 08/13/2019   HDL 47 08/13/2019   LDLCALC 89 08/13/2019   TRIG 72 08/13/2019   Lab Results  Component Value Date   WBC 5.4 03/12/2019   HGB 16.9 03/12/2019   HCT 50.1 03/12/2019   MCV 101 (H) 03/12/2019   No results found for: IRON, TIBC, FERRITIN  Attestation Statements:   Reviewed by clinician on day of visit: allergies, medications, problem list, medical history, surgical history, family history, social history, and previous encounter notes.  IMichaelene Song, am acting as transcriptionist for Abby Potash, PA-C   I have reviewed the above documentation for accuracy and completeness, and I agree with the above. Abby Potash, PA-C

## 2019-11-06 ENCOUNTER — Other Ambulatory Visit (INDEPENDENT_AMBULATORY_CARE_PROVIDER_SITE_OTHER): Payer: Self-pay | Admitting: Family Medicine

## 2019-11-06 DIAGNOSIS — E559 Vitamin D deficiency, unspecified: Secondary | ICD-10-CM

## 2019-11-06 MED FILL — LEVOTHYROXINE 88 MCG TABLET: 88 | 90 days supply | Qty: 90 | Fill #3

## 2019-11-06 MED FILL — ATORVASTATIN 40 MG TABLET: 40 | 90 days supply | Qty: 90 | Fill #1

## 2019-11-08 MED FILL — SILDENAFIL CITRATE 100 MG T: 100 | 30 days supply | Qty: 6 | Fill #1

## 2019-11-18 MED FILL — MIRTAZAPINE 15 MG TABLET: 15 | 90 days supply | Qty: 90 | Fill #0

## 2019-11-18 MED FILL — traZODone HCL 100 MG TABS: 100 | 90 days supply | Qty: 90 | Fill #0

## 2019-11-21 ENCOUNTER — Other Ambulatory Visit (INDEPENDENT_AMBULATORY_CARE_PROVIDER_SITE_OTHER): Payer: Self-pay | Admitting: Family Medicine

## 2019-11-21 DIAGNOSIS — E669 Obesity, unspecified: Secondary | ICD-10-CM

## 2019-11-21 MED FILL — ADDERALL XR 20 MG CAP SA: 20 | 30 days supply | Qty: 30 | Fill #0

## 2019-11-21 MED FILL — SAXENDA 18 MG/3 ML PEN: 18 | 30 days supply | Qty: 15 | Fill #0

## 2019-11-25 MED FILL — LATUDA 40 MG TABLET: 40 | 90 days supply | Qty: 90 | Fill #0

## 2019-11-26 MED FILL — DIVALPROEX SOD ER 500 MG TA: 500 | 90 days supply | Qty: 180 | Fill #0

## 2019-11-28 ENCOUNTER — Ambulatory Visit (INDEPENDENT_AMBULATORY_CARE_PROVIDER_SITE_OTHER): Payer: 59 | Admitting: Physician Assistant

## 2019-11-29 ENCOUNTER — Other Ambulatory Visit (INDEPENDENT_AMBULATORY_CARE_PROVIDER_SITE_OTHER): Payer: Self-pay | Admitting: Physician Assistant

## 2019-11-29 DIAGNOSIS — E559 Vitamin D deficiency, unspecified: Secondary | ICD-10-CM

## 2019-12-03 ENCOUNTER — Other Ambulatory Visit (INDEPENDENT_AMBULATORY_CARE_PROVIDER_SITE_OTHER): Payer: Self-pay | Admitting: Physician Assistant

## 2019-12-03 DIAGNOSIS — E559 Vitamin D deficiency, unspecified: Secondary | ICD-10-CM

## 2019-12-05 MED FILL — SILDENAFIL CITRATE 100 MG T: 100 | 30 days supply | Qty: 6 | Fill #0

## 2019-12-09 ENCOUNTER — Other Ambulatory Visit (INDEPENDENT_AMBULATORY_CARE_PROVIDER_SITE_OTHER): Payer: Self-pay | Admitting: Physician Assistant

## 2019-12-09 DIAGNOSIS — E559 Vitamin D deficiency, unspecified: Secondary | ICD-10-CM

## 2019-12-16 ENCOUNTER — Other Ambulatory Visit (INDEPENDENT_AMBULATORY_CARE_PROVIDER_SITE_OTHER): Payer: Self-pay | Admitting: Physician Assistant

## 2019-12-16 DIAGNOSIS — E559 Vitamin D deficiency, unspecified: Secondary | ICD-10-CM

## 2019-12-18 ENCOUNTER — Ambulatory Visit (INDEPENDENT_AMBULATORY_CARE_PROVIDER_SITE_OTHER): Payer: 59 | Admitting: Physician Assistant

## 2019-12-20 MED FILL — metFORMIN HCL 500 MG TABS: 500 | 30 days supply | Qty: 60 | Fill #0

## 2019-12-23 ENCOUNTER — Other Ambulatory Visit (INDEPENDENT_AMBULATORY_CARE_PROVIDER_SITE_OTHER): Payer: Self-pay | Admitting: Family Medicine

## 2019-12-23 ENCOUNTER — Other Ambulatory Visit (INDEPENDENT_AMBULATORY_CARE_PROVIDER_SITE_OTHER): Payer: Self-pay | Admitting: Physician Assistant

## 2019-12-23 DIAGNOSIS — E8881 Metabolic syndrome: Secondary | ICD-10-CM

## 2019-12-23 DIAGNOSIS — E559 Vitamin D deficiency, unspecified: Secondary | ICD-10-CM

## 2019-12-24 ENCOUNTER — Encounter (INDEPENDENT_AMBULATORY_CARE_PROVIDER_SITE_OTHER): Payer: Self-pay | Admitting: Physician Assistant

## 2019-12-24 ENCOUNTER — Other Ambulatory Visit: Payer: Self-pay

## 2019-12-24 ENCOUNTER — Ambulatory Visit (INDEPENDENT_AMBULATORY_CARE_PROVIDER_SITE_OTHER): Payer: 59 | Admitting: Physician Assistant

## 2019-12-24 VITALS — BP 109/76 | HR 66 | Temp 98.1°F | Ht 74.0 in | Wt 246.0 lb

## 2019-12-24 DIAGNOSIS — Z6831 Body mass index (BMI) 31.0-31.9, adult: Secondary | ICD-10-CM

## 2019-12-24 DIAGNOSIS — E7849 Other hyperlipidemia: Secondary | ICD-10-CM | POA: Diagnosis not present

## 2019-12-24 DIAGNOSIS — R7303 Prediabetes: Secondary | ICD-10-CM

## 2019-12-24 DIAGNOSIS — E669 Obesity, unspecified: Secondary | ICD-10-CM | POA: Diagnosis not present

## 2019-12-24 DIAGNOSIS — E559 Vitamin D deficiency, unspecified: Secondary | ICD-10-CM

## 2019-12-24 DIAGNOSIS — Z9189 Other specified personal risk factors, not elsewhere classified: Secondary | ICD-10-CM | POA: Diagnosis not present

## 2019-12-24 MED ORDER — UNIFINE PENTIPS 32G X 4 MM MISC: 0 refills | Status: DC

## 2019-12-24 MED ORDER — SAXENDA 18 MG/3ML ~~LOC~~ SOPN: PEN_INJECTOR | SUBCUTANEOUS | 0 refills | Status: DC

## 2019-12-24 MED FILL — UNIFINE PENTIPS 32GX5/32: 32G X 4 MM | 90 days supply | Qty: 100 | Fill #0

## 2019-12-24 MED FILL — SAXENDA 18 MG/3 ML PEN: 18 | 30 days supply | Qty: 15 | Fill #0

## 2019-12-25 LAB — COMPREHENSIVE METABOLIC PANEL
ALT: 38 IU/L (ref 0–44)
AST: 26 IU/L (ref 0–40)
Albumin/Globulin Ratio: 1.9 (ref 1.2–2.2)
Albumin: 4 g/dL (ref 4.0–5.0)
Alkaline Phosphatase: 58 IU/L (ref 39–117)
BUN/Creatinine Ratio: 17 (ref 9–20)
BUN: 16 mg/dL (ref 6–24)
Bilirubin Total: 0.7 mg/dL (ref 0.0–1.2)
CO2: 30 mmol/L — ABNORMAL HIGH (ref 20–29)
Calcium: 9.3 mg/dL (ref 8.7–10.2)
Chloride: 99 mmol/L (ref 96–106)
Creatinine, Ser: 0.95 mg/dL (ref 0.76–1.27)
GFR calc Af Amer: 109 mL/min/{1.73_m2} (ref >59)
GFR calc non Af Amer: 94 mL/min/{1.73_m2} (ref >59)
Globulin, Total: 2.1 g/dL (ref 1.5–4.5)
Glucose: 91 mg/dL (ref 65–99)
Potassium: 4.5 mmol/L (ref 3.5–5.2)
Sodium: 142 mmol/L (ref 134–144)
Total Protein: 6.1 g/dL (ref 6.0–8.5)

## 2019-12-25 LAB — LIPID PANEL WITH LDL/HDL RATIO
Cholesterol, Total: 149 mg/dL (ref 100–199)
HDL: 42 mg/dL (ref >39)
LDL Chol Calc (NIH): 91 mg/dL (ref 0–99)
LDL/HDL Ratio: 2.2 ratio (ref 0.0–3.6)
Triglycerides: 81 mg/dL (ref 0–149)
VLDL Cholesterol Cal: 16 mg/dL (ref 5–40)

## 2019-12-25 LAB — VITAMIN D 25 HYDROXY (VIT D DEFICIENCY, FRACTURES): Vit D, 25-Hydroxy: 97 ng/mL (ref 30.0–100.0)

## 2019-12-25 LAB — HEMOGLOBIN A1C
Est. average glucose Bld gHb Est-mCnc: 91 mg/dL
Hgb A1c MFr Bld: 4.8 % (ref 4.8–5.6)

## 2019-12-25 LAB — INSULIN, RANDOM: INSULIN: 13 u[IU]/mL (ref 2.6–24.9)

## 2019-12-25 NOTE — Progress Notes (Signed)
Please advise that his vitamin D level was 97 and to stop his prescription Vit D. He may continue his OTC. Thanks

## 2019-12-25 NOTE — Progress Notes (Signed)
Chief Complaint:   OBESITY Brian Cunningham is here to discuss his progress with his obesity treatment plan along with follow-up of his obesity related diagnoses. Brian Cunningham is on the Category 4 Plan 50% of the time and journaling 2000 calories and 115 grams of protein 80% of the time. Brian Cunningham states he is weightlifting 30 minutes 2 times per week and walking dogs 20 minutes 7 times per week.  Today's visit was #: 11 Starting weight: 262 lbs Starting date: 0714/2020 Today's weight: 246 lbs Today's date: 12/24/2019 Total lbs lost to date: 16 Total lbs lost since last in-office visit: 0  Interim History: Brian Cunningham states that he has restarted journaling. He is drinking protein shakes daily and buying food at the cafeteria for lunch.  Subjective:   Other hyperlipidemia. Brian Cunningham is on atorvastatin. No chest pain. No myalgias. He is due for labs.   Lab Results  Component Value Date   CHOL 150 08/13/2019   HDL 47 08/13/2019   LDLCALC 89 08/13/2019   TRIG 72 08/13/2019   Lab Results  Component Value Date   ALT 62 (H) 08/13/2019   AST 34 08/13/2019   ALKPHOS 59 08/13/2019   BILITOT 0.7 08/13/2019   The 10-year ASCVD risk score Mikey Bussing DC Jr., et al., 2013) is: 3.5%   Values used to calculate the score:     Age: 27 years     Sex: Male     Is Non-Hispanic African American: No     Diabetic: No     Tobacco smoker: Yes     Systolic Blood Pressure: 0000000 mmHg     Is BP treated: No     HDL Cholesterol: 47 mg/dL     Total Cholesterol: 150 mg/dL  Vitamin D deficiency. Brian Cunningham is taking weekly prescription and OTC Vitamin D. Last Vitamin D level was at goal - 66.5 on 08/13/2019.  Prediabetes. Brian Cunningham has a diagnosis of prediabetes based on his elevated HgA1c and was informed this puts him at greater risk of developing diabetes. He continues to work on diet and exercise to decrease his risk of diabetes. He denies nausea, vomiting, or diarrhea. No polyphagia.  Brian Cunningham is on metformin. He is due for labs.  Lab Results  Component Value Date   HGBA1C 4.8 08/13/2019   Lab Results  Component Value Date   INSULIN 18.1 08/13/2019   INSULIN 31.1 (H) 03/12/2019   At risk for diabetes mellitus. Brian Cunningham is at higher than average risk for developing diabetes due to his obesity.   Assessment/Plan:   Other hyperlipidemia. Cardiovascular risk and specific lipid/LDL goals reviewed.  We discussed several lifestyle modifications today and Sherif will continue to work on diet, exercise and weight loss efforts. Orders and follow up as documented in patient record. He will continue his medication as directed. Lipid Panel With LDL/HDL Ratio ordered today.  Counseling Intensive lifestyle modifications are the first line treatment for this issue. . Dietary changes: Increase soluble fiber. Decrease simple carbohydrates. . Exercise changes: Moderate to vigorous-intensity aerobic activity 150 minutes per week if tolerated. . Lipid-lowering medications: see documented in medical record.     Vitamin D deficiency. Low Vitamin D level contributes to fatigue and are associated with obesity, breast, and colon cancer. VITAMIN D 25 Hydroxy (Vit-D Deficiency, Fractures) level was ordered. Will refill Vitamin D if needed based on results.  Prediabetes. Brian Cunningham will continue to work on weight loss, exercise, and decreasing simple carbohydrates to help decrease the risk of diabetes. He will continue  his medication as directed. Comprehensive metabolic panel, Hemoglobin A1c, Insulin, random labs ordered today.  At risk for diabetes mellitus. Brian Cunningham was given approximately 15 minutes of diabetes education and counseling today. We discussed intensive lifestyle modifications today with an emphasis on weight loss as well as increasing exercise and decreasing simple carbohydrates in his diet. We also reviewed medication options with an emphasis on risk versus benefit  of those discussed.   Repetitive spaced learning was employed today to elicit superior memory formation and behavioral change.  Class 1 obesity with serious comorbidity and body mass index (BMI) of 31.0 to 31.9 in adult, unspecified obesity type.  Refills were given for Insulin Pen Needle (UNIFINE PENTIPS) 32G X 4 MM MISC #100 and Liraglutide -Weight Management (SAXENDA) 18 MG/3ML SOPN #5 with 0 refills.  Brian Cunningham is currently in the action stage of change. As such, his goal is to continue with weight loss efforts. He has agreed to the Category 4 Plan.   Exercise goals: For substantial health benefits, adults should do at least 150 minutes (2 hours and 30 minutes) a week of moderate-intensity, or 75 minutes (1 hour and 15 minutes) a week of vigorous-intensity aerobic physical activity, or an equivalent combination of moderate- and vigorous-intensity aerobic activity. Aerobic activity should be performed in episodes of at least 10 minutes, and preferably, it should be spread throughout the week.  Behavioral modification strategies: no skipping meals and keeping healthy foods in the home.  Brian Cunningham has agreed to follow-up with our clinic in 2-3 weeks. He was informed of the importance of frequent follow-up visits to maximize his success with intensive lifestyle modifications for his multiple health conditions.   Brian Cunningham was informed we would discuss his lab results at his next visit unless there is a critical issue that needs to be addressed sooner. Brian Cunningham agreed to keep his next visit at the agreed upon time to discuss these results.  Objective:   Blood pressure 109/76, pulse 66, temperature 98.1 F (36.7 C), temperature source Oral, height 6\' 2"  (1.88 m), weight 246 lb (111.6 kg), SpO2 97 %. Body mass index is 31.58 kg/m.  General: Cooperative, alert, well developed, in no acute distress. HEENT: Conjunctivae and lids unremarkable. Cardiovascular: Regular rhythm.  Lungs:  Normal work of breathing. Neurologic: No focal deficits.   Lab Results  Component Value Date   CREATININE 0.95 08/13/2019   BUN 15 08/13/2019   NA 142 08/13/2019   K 4.6 08/13/2019   CL 98 08/13/2019   CO2 27 08/13/2019   Lab Results  Component Value Date   ALT 62 (H) 08/13/2019   AST 34 08/13/2019   ALKPHOS 59 08/13/2019   BILITOT 0.7 08/13/2019   Lab Results  Component Value Date   HGBA1C 4.8 08/13/2019   HGBA1C 5.2 03/12/2019   Lab Results  Component Value Date   INSULIN 18.1 08/13/2019   INSULIN 31.1 (H) 03/12/2019   Lab Results  Component Value Date   TSH 1.990 08/13/2019   Lab Results  Component Value Date   CHOL 150 08/13/2019   HDL 47 08/13/2019   LDLCALC 89 08/13/2019   TRIG 72 08/13/2019   Lab Results  Component Value Date   WBC 5.4 03/12/2019   HGB 16.9 03/12/2019   HCT 50.1 03/12/2019   MCV 101 (H) 03/12/2019   No results found for: IRON, TIBC, FERRITIN  Attestation Statements:   Reviewed by clinician on day of visit: allergies, medications, problem list, medical history, surgical history, family history, social history,  and previous encounter notes.  IMichaelene Song, am acting as transcriptionist for Abby Potash, PA-C   I have reviewed the above documentation for accuracy and completeness, and I agree with the above. Abby Potash, PA-C

## 2019-12-26 MED FILL — ADDERALL XR 20 MG CAP SA: 20 | 30 days supply | Qty: 30 | Fill #0

## 2019-12-30 ENCOUNTER — Other Ambulatory Visit (INDEPENDENT_AMBULATORY_CARE_PROVIDER_SITE_OTHER): Payer: Self-pay

## 2019-12-30 ENCOUNTER — Other Ambulatory Visit (INDEPENDENT_AMBULATORY_CARE_PROVIDER_SITE_OTHER): Payer: Self-pay | Admitting: Physician Assistant

## 2019-12-30 DIAGNOSIS — E559 Vitamin D deficiency, unspecified: Secondary | ICD-10-CM

## 2019-12-30 NOTE — Progress Notes (Signed)
Left pt 2 messages will call again today. Renee Ramus, LPN

## 2020-01-06 ENCOUNTER — Other Ambulatory Visit (INDEPENDENT_AMBULATORY_CARE_PROVIDER_SITE_OTHER): Payer: Self-pay | Admitting: Physician Assistant

## 2020-01-06 DIAGNOSIS — E559 Vitamin D deficiency, unspecified: Secondary | ICD-10-CM

## 2020-01-21 ENCOUNTER — Ambulatory Visit (INDEPENDENT_AMBULATORY_CARE_PROVIDER_SITE_OTHER): Payer: 59 | Admitting: Physician Assistant

## 2020-01-23 MED FILL — OMEPRAZOLE 40 MG CPDR: 40 | 90 days supply | Qty: 90 | Fill #0

## 2020-01-29 MED FILL — ADDERALL XR 20 MG CAP SA: 20 | 30 days supply | Qty: 30 | Fill #0

## 2020-02-07 DIAGNOSIS — G4700 Insomnia, unspecified: Secondary | ICD-10-CM | POA: Diagnosis not present

## 2020-02-07 DIAGNOSIS — F902 Attention-deficit hyperactivity disorder, combined type: Secondary | ICD-10-CM | POA: Diagnosis not present

## 2020-02-07 DIAGNOSIS — F3132 Bipolar disorder, current episode depressed, moderate: Secondary | ICD-10-CM | POA: Diagnosis not present

## 2020-02-13 ENCOUNTER — Ambulatory Visit (INDEPENDENT_AMBULATORY_CARE_PROVIDER_SITE_OTHER): Payer: 59 | Admitting: Family Medicine

## 2020-02-13 ENCOUNTER — Other Ambulatory Visit: Payer: Self-pay

## 2020-02-13 ENCOUNTER — Encounter (INDEPENDENT_AMBULATORY_CARE_PROVIDER_SITE_OTHER): Payer: Self-pay | Admitting: Family Medicine

## 2020-02-13 VITALS — BP 114/80 | HR 78 | Temp 98.3°F | Ht 74.0 in | Wt 242.0 lb

## 2020-02-13 DIAGNOSIS — E669 Obesity, unspecified: Secondary | ICD-10-CM

## 2020-02-13 DIAGNOSIS — Z9189 Other specified personal risk factors, not elsewhere classified: Secondary | ICD-10-CM

## 2020-02-13 DIAGNOSIS — E559 Vitamin D deficiency, unspecified: Secondary | ICD-10-CM | POA: Diagnosis not present

## 2020-02-13 DIAGNOSIS — Z6831 Body mass index (BMI) 31.0-31.9, adult: Secondary | ICD-10-CM | POA: Diagnosis not present

## 2020-02-13 DIAGNOSIS — E8881 Metabolic syndrome: Secondary | ICD-10-CM | POA: Diagnosis not present

## 2020-02-13 MED ORDER — METFORMIN HCL 500 MG PO TABS: 500.0000 mg | ORAL_TABLET | Freq: Two times a day (BID) | ORAL | 0 refills | Status: DC

## 2020-02-13 MED ORDER — UNIFINE PENTIPS 32G X 4 MM MISC: 0 refills | Status: DC

## 2020-02-13 MED ORDER — SAXENDA 18 MG/3ML ~~LOC~~ SOPN: PEN_INJECTOR | SUBCUTANEOUS | 0 refills | Status: DC

## 2020-02-13 MED FILL — METFORMIN HCL 500 MG TABS: 500 | 30 days supply | Qty: 60 | Fill #0

## 2020-02-13 MED FILL — SAXENDA 18 MG/3 ML PEN: 18 | 30 days supply | Qty: 15 | Fill #0

## 2020-02-17 NOTE — Progress Notes (Signed)
Chief Complaint:   OBESITY Brian Cunningham is here to discuss his progress with his obesity treatment plan along with follow-up of his obesity related diagnoses. Brian Cunningham is on the Category 4 Plan and states he is following his eating plan approximately 50% of the time. Brian Cunningham states he is running, and weight lifting (1 time) for 60 minutes 3-4 times per week.  Today's visit was #: 12 Starting weight: 262 lbs Starting date: 03/12/2019 Today's weight: 242 lbs Today's date: 02/13/2020 Total lbs lost to date: 20 Total lbs lost since last in-office visit: 4  Interim History: Brian Cunningham has not been into the office since 12/24/2019 and he is down 4 lbs.. He is journaling more than following the Category 4. 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 He works evening shift. He is on Saxenda 3.0 mg daily. His appetite is well managed.  Subjective:   1. Vitamin D deficiency Brian Cunningham is on Vit D 5,000 q daily. Last Vit D level was 99 at his last check, nearly over-replaced. I discussed labs with the patient today.  2. Insulin resistance Brian Cunningham has a diagnosis of insulin resistance based on his elevated fasting insulin level >5. He denies polyphagia. He is on Saxenda and metformin. He continues to work on diet and exercise to decrease his risk of diabetes. I discussed labs with the patient today.  Lab Results  Component Value Date   INSULIN 13.0 12/24/2019   INSULIN 18.1 08/13/2019   INSULIN 31.1 (H) 03/12/2019   Lab Results  Component Value Date   HGBA1C 4.8 12/24/2019   3. At risk for diabetes mellitus Karen is at higher than average risk for developing diabetes due to his obesity and insulin resistance..   Assessment/Plan:   1. Vitamin D deficiency Low Vitamin D level contributes to fatigue and are associated with obesity, breast, and colon cancer. Shalin agreed to continue taking OTC Vit D 5,000 IU q daily and will follow-up for routine testing of  Vitamin D, at least 2-3 times per year to avoid over-replacement.  2. Insulin resistance Lorry will continue to work on weight loss, exercise, and decreasing simple carbohydrates to help decrease the risk of diabetes. We will refill metformin for 1 month. Brian Cunningham agreed to follow-up with [name] as directed to closely monitor his progress.  - metFORMIN (GLUCOPHAGE) 500 MG tablet; Take 1 tablet (500 mg total) by mouth 2 (two) times daily with a meal.  Dispense: 60 tablet; Refill: 0  3. At risk for diabetes mellitus Brian Cunningham was given approximately 15 minutes of diabetes education and counseling today. We discussed intensive lifestyle modifications today with an emphasis on weight loss as well as increasing exercise and decreasing simple carbohydrates in his diet. We also reviewed medication options with an emphasis on risk versus benefit of those discussed.   Repetitive spaced learning was employed today to elicit superior memory formation and behavioral change.  4. Class 1 obesity with serious comorbidity and body mass index (BMI) of 31.0 to 31.9 in adult, unspecified obesity type Brian Cunningham is currently in the action stage of change. As such, his goal is to continue with weight loss efforts. He has agreed to keeping a food journal and adhering to recommended goals of 2000 calories and 115 grams of protein daily.   We discussed various medication options to help Brian Cunningham with his weight loss efforts and we both agreed to continue Saxenda at 3.0 mg daily and we will refill for 1 month, and we will refill  nano needles #100 with no refills.  - Liraglutide -Weight Management (SAXENDA) 18 MG/3ML SOPN; INJECT 3MG  AS DIRECTED DAILY  Dispense: 15 mL; Refill: 0 - Insulin Pen Needle (UNIFINE PENTIPS) 32G X 4 MM MISC; USE WITH SAXENDA DAILY  Dispense: 100 each; Refill: 0  Exercise goals: As is.  Behavioral modification strategies: increasing lean protein intake and keeping a strict food  journal.  Jamere has agreed to follow-up with our clinic in 3 weeks. He was informed of the importance of frequent follow-up visits to maximize his success with intensive lifestyle modifications for his multiple health conditions.   Objective:   Blood pressure 114/80, pulse 78, temperature 98.3 F (36.8 C), temperature source Oral, height 6\' 2"  (1.88 m), weight 242 lb (109.8 kg), SpO2 97 %. Body mass index is 31.07 kg/m.  General: Cooperative, alert, well developed, in no acute distress. HEENT: Conjunctivae and lids unremarkable. Cardiovascular: Regular rhythm.  Lungs: Normal work of breathing. Neurologic: No focal deficits.   Lab Results  Component Value Date   CREATININE 0.95 12/24/2019   BUN 16 12/24/2019   NA 142 12/24/2019   K 4.5 12/24/2019   CL 99 12/24/2019   CO2 30 (H) 12/24/2019   Lab Results  Component Value Date   ALT 38 12/24/2019   AST 26 12/24/2019   ALKPHOS 58 12/24/2019   BILITOT 0.7 12/24/2019   Lab Results  Component Value Date   HGBA1C 4.8 12/24/2019   HGBA1C 4.8 08/13/2019   HGBA1C 5.2 03/12/2019   Lab Results  Component Value Date   INSULIN 13.0 12/24/2019   INSULIN 18.1 08/13/2019   INSULIN 31.1 (H) 03/12/2019   Lab Results  Component Value Date   TSH 1.990 08/13/2019   Lab Results  Component Value Date   CHOL 149 12/24/2019   HDL 42 12/24/2019   LDLCALC 91 12/24/2019   TRIG 81 12/24/2019   Lab Results  Component Value Date   WBC 5.4 03/12/2019   HGB 16.9 03/12/2019   HCT 50.1 03/12/2019   MCV 101 (H) 03/12/2019   No results found for: IRON, TIBC, FERRITIN  Attestation Statements:   Reviewed by clinician on day of visit: allergies, medications, problem list, medical history, surgical history, family history, social history, and previous encounter notes.   Wilhemena Durie, am acting as Location manager for [employer], FNP-C.  I have reviewed the above documentation for accuracy and completeness, and I agree with  the above. -  Georgianne Fick, FNP

## 2020-02-18 ENCOUNTER — Encounter (INDEPENDENT_AMBULATORY_CARE_PROVIDER_SITE_OTHER): Payer: Self-pay | Admitting: Family Medicine

## 2020-02-18 DIAGNOSIS — Z6831 Body mass index (BMI) 31.0-31.9, adult: Secondary | ICD-10-CM | POA: Insufficient documentation

## 2020-02-18 DIAGNOSIS — E88819 Insulin resistance, unspecified: Secondary | ICD-10-CM | POA: Insufficient documentation

## 2020-02-18 DIAGNOSIS — E669 Obesity, unspecified: Secondary | ICD-10-CM | POA: Insufficient documentation

## 2020-02-18 DIAGNOSIS — E8881 Metabolic syndrome: Secondary | ICD-10-CM | POA: Insufficient documentation

## 2020-02-18 DIAGNOSIS — E559 Vitamin D deficiency, unspecified: Secondary | ICD-10-CM | POA: Insufficient documentation

## 2020-02-21 DIAGNOSIS — R197 Diarrhea, unspecified: Secondary | ICD-10-CM | POA: Diagnosis not present

## 2020-02-21 DIAGNOSIS — E7800 Pure hypercholesterolemia, unspecified: Secondary | ICD-10-CM | POA: Diagnosis not present

## 2020-02-21 DIAGNOSIS — Z0000 Encounter for general adult medical examination without abnormal findings: Secondary | ICD-10-CM | POA: Diagnosis not present

## 2020-02-21 DIAGNOSIS — R7303 Prediabetes: Secondary | ICD-10-CM | POA: Diagnosis not present

## 2020-02-21 DIAGNOSIS — E032 Hypothyroidism due to medicaments and other exogenous substances: Secondary | ICD-10-CM | POA: Diagnosis not present

## 2020-02-24 MED FILL — LATUDA 40 MG TABLET: 40 | 90 days supply | Qty: 90 | Fill #0

## 2020-02-24 MED FILL — DIVALPROEX SOD ER 500 MG TA: 500 | 90 days supply | Qty: 180 | Fill #0

## 2020-02-24 MED FILL — traZODone HCL 100 MG TABS: 100 | 90 days supply | Qty: 90 | Fill #0

## 2020-02-24 MED FILL — MIRTAZAPINE 15 MG TABLET: 15 | 90 days supply | Qty: 90 | Fill #0

## 2020-03-03 MED FILL — SILDENAFIL CITRATE 100 MG T: 100 | 30 days supply | Qty: 6 | Fill #0

## 2020-03-04 MED FILL — ADDERALL XR 20 MG CAP SA: 20 | 30 days supply | Qty: 30 | Fill #0

## 2020-03-05 MED FILL — NARCAN 4 MG NASAL SPRAY: 4 | 15 days supply | Qty: 2 | Fill #0

## 2020-03-12 ENCOUNTER — Ambulatory Visit (INDEPENDENT_AMBULATORY_CARE_PROVIDER_SITE_OTHER): Payer: 59 | Admitting: Family Medicine

## 2020-03-12 ENCOUNTER — Encounter (INDEPENDENT_AMBULATORY_CARE_PROVIDER_SITE_OTHER): Payer: Self-pay | Admitting: Family Medicine

## 2020-03-12 ENCOUNTER — Other Ambulatory Visit: Payer: Self-pay

## 2020-03-12 VITALS — BP 96/64 | HR 75 | Temp 98.0°F | Ht 74.0 in | Wt 240.0 lb

## 2020-03-12 DIAGNOSIS — Z6830 Body mass index (BMI) 30.0-30.9, adult: Secondary | ICD-10-CM

## 2020-03-12 DIAGNOSIS — E669 Obesity, unspecified: Secondary | ICD-10-CM | POA: Diagnosis not present

## 2020-03-12 DIAGNOSIS — E66811 Obesity, class 1: Secondary | ICD-10-CM

## 2020-03-12 DIAGNOSIS — Z9189 Other specified personal risk factors, not elsewhere classified: Secondary | ICD-10-CM

## 2020-03-12 DIAGNOSIS — E88819 Insulin resistance, unspecified: Secondary | ICD-10-CM

## 2020-03-12 DIAGNOSIS — E8881 Metabolic syndrome: Secondary | ICD-10-CM | POA: Diagnosis not present

## 2020-03-12 DIAGNOSIS — E7849 Other hyperlipidemia: Secondary | ICD-10-CM | POA: Diagnosis not present

## 2020-03-12 MED ORDER — UNIFINE PENTIPS 32G X 4 MM MISC: 0 refills | Status: DC

## 2020-03-12 MED ORDER — METFORMIN HCL 500 MG PO TABS: 500.0000 mg | ORAL_TABLET | Freq: Two times a day (BID) | ORAL | 0 refills | Status: DC

## 2020-03-12 MED ORDER — SAXENDA 18 MG/3ML ~~LOC~~ SOPN: PEN_INJECTOR | SUBCUTANEOUS | 0 refills | Status: DC

## 2020-03-12 MED FILL — UNIFINE PENTIPS 32GX5/32: 32G X 4 MM | 90 days supply | Qty: 100 | Fill #0

## 2020-03-12 MED FILL — METFORMIN HCL 500 MG TABS: 500 | 30 days supply | Qty: 60 | Fill #0

## 2020-03-12 MED FILL — SAXENDA 18 MG/3 ML PEN: 18 | 30 days supply | Qty: 15 | Fill #0

## 2020-03-14 ENCOUNTER — Other Ambulatory Visit (INDEPENDENT_AMBULATORY_CARE_PROVIDER_SITE_OTHER): Payer: Self-pay | Admitting: Family Medicine

## 2020-03-14 DIAGNOSIS — E8881 Metabolic syndrome: Secondary | ICD-10-CM

## 2020-03-16 NOTE — Progress Notes (Signed)
Chief Complaint:   OBESITY Brian Cunningham is here to discuss his progress with his obesity treatment plan along with follow-up of his obesity related diagnoses. Brian Cunningham is on the Category 4 Plan and states he is following his eating plan approximately 50% of the time. Brian Cunningham states he is running for 45 minutes 2 times per week.  Today's visit was #: 13 Starting weight: 262 lbs Starting date: 03/12/2019 Today's weight: 240 lbs Today's date: 03/12/2020 Total lbs lost to date: 22 Total lbs lost since last in-office visit: 2  Interim History: Brian Cunningham continues to do well with weight loss on his Category 4 plan. His hunger is controlled and he is enjoying running again. He is doing well on Saxenda, and he denies nausea or vomiting.  Subjective:   1. Insulin resistance Brian Cunningham is stable on metformin and Saxenda, and he is doing well with diet, weight loss, and exercise.  2. Other hyperlipidemia Brian Cunningham is stable on Lipitor, and he is doing well on his diet prescription.  3. At risk for complication associated with hypotension The patient is at a higher than average risk of hypotension due to weight loss.  Assessment/Plan:   1. Insulin resistance Brian Cunningham will continue to work on weight loss, exercise, and decreasing simple carbohydrates to help decrease the risk of diabetes. We will refill metformin for 1 month, and we will recheck labs next month. Brian Cunningham agreed to follow-up with [name] as directed to closely monitor his progress.  - metFORMIN (GLUCOPHAGE) 500 MG tablet; Take 1 tablet (500 mg total) by mouth 2 (two) times daily with a meal.  Dispense: 60 tablet; Refill: 0  2. Other hyperlipidemia Cardiovascular risk and specific lipid/LDL goals reviewed. We discussed several lifestyle modifications today. Brian Cunningham will continue Lipitor, and will continue to work on diet, exercise and weight loss efforts. We will recheck labs next month. Orders and follow up  as documented in patient record.   3. At risk for complication associated with hypotension Brian Cunningham was given approximately 15 minutes of education and counseling today to help avoid hypotension. We discussed risks of hypotension with weight loss and signs of hypotension such as feeling lightheaded or unsteady.  Repetitive spaced learning was employed today to elicit superior memory formation and behavioral change.  4. Class 1 obesity with serious comorbidity and body mass index (BMI) of 30.0 to 30.9 in adult, unspecified obesity type Brian Cunningham is currently in the action stage of change. As such, his goal is to continue with weight loss efforts. He has agreed to the Category 4 Plan.   We discussed various medication options to help Brian Cunningham with his weight loss efforts and we both agreed to continue Saxenda at 3.0 mg and we will refill for 1 month, and we will refill nano needles #100 with no refills.  - Insulin Pen Needle (UNIFINE PENTIPS) 32G X 4 MM MISC; USE WITH SAXENDA DAILY  Dispense: 100 each; Refill: 0 - Liraglutide -Weight Management (SAXENDA) 18 MG/3ML SOPN; INJECT 3MG  AS DIRECTED DAILY  Dispense: 15 mL; Refill: 0  Exercise goals: As is.  Behavioral modification strategies: increasing lean protein intake and increasing water intake.  Brian Cunningham has agreed to follow-up with our clinic in 4 weeks. He was informed of the importance of frequent follow-up visits to maximize his success with intensive lifestyle modifications for his multiple health conditions.   Objective:   Blood pressure 96/64, pulse 75, temperature 98 F (36.7 C), temperature source Oral, height 6\' 2"  (1.88 m), weight 240 lb (108.9  kg), SpO2 96 %. Body mass index is 30.81 kg/m.  General: Cooperative, alert, well developed, in no acute distress. HEENT: Conjunctivae and lids unremarkable. Cardiovascular: Regular rhythm.  Lungs: Normal work of breathing. Neurologic: No focal deficits.   Lab Results    Component Value Date   CREATININE 0.95 12/24/2019   BUN 16 12/24/2019   NA 142 12/24/2019   K 4.5 12/24/2019   CL 99 12/24/2019   CO2 30 (H) 12/24/2019   Lab Results  Component Value Date   ALT 38 12/24/2019   AST 26 12/24/2019   ALKPHOS 58 12/24/2019   BILITOT 0.7 12/24/2019   Lab Results  Component Value Date   HGBA1C 4.8 12/24/2019   HGBA1C 4.8 08/13/2019   HGBA1C 5.2 03/12/2019   Lab Results  Component Value Date   INSULIN 13.0 12/24/2019   INSULIN 18.1 08/13/2019   INSULIN 31.1 (H) 03/12/2019   Lab Results  Component Value Date   TSH 1.990 08/13/2019   Lab Results  Component Value Date   CHOL 149 12/24/2019   HDL 42 12/24/2019   LDLCALC 91 12/24/2019   TRIG 81 12/24/2019   Lab Results  Component Value Date   WBC 5.4 03/12/2019   HGB 16.9 03/12/2019   HCT 50.1 03/12/2019   MCV 101 (H) 03/12/2019   No results found for: IRON, TIBC, FERRITIN  Attestation Statements:   Reviewed by clinician on day of visit: allergies, medications, problem list, medical history, surgical history, family history, social history, and previous encounter notes.   I, Trixie Dredge, am acting as transcriptionist for Dennard Nip, MD.  I have reviewed the above documentation for accuracy and completeness, and I agree with the above. -  Dennard Nip, MD

## 2020-03-30 MED FILL — SILDENAFIL CITRATE 100 MG T: 100 | 30 days supply | Qty: 6 | Fill #1

## 2020-03-30 MED FILL — VENLAFAXINE HCL ER 75 MG CA: 75 | 90 days supply | Qty: 90 | Fill #0

## 2020-04-08 MED FILL — ADDERALL XR 20 MG CAP SA: 20 | 30 days supply | Qty: 30 | Fill #0

## 2020-04-17 ENCOUNTER — Other Ambulatory Visit (HOSPITAL_COMMUNITY): Payer: Self-pay | Admitting: Family Medicine

## 2020-04-17 MED FILL — OMEPRAZOLE 40 MG CPDR: 40 | 90 days supply | Qty: 90 | Fill #0

## 2020-04-27 MED FILL — SILDENAFIL CITRATE 100 MG T: 100 | 30 days supply | Qty: 6 | Fill #2

## 2020-05-06 DIAGNOSIS — F3132 Bipolar disorder, current episode depressed, moderate: Secondary | ICD-10-CM | POA: Diagnosis not present

## 2020-05-06 DIAGNOSIS — G4700 Insomnia, unspecified: Secondary | ICD-10-CM | POA: Diagnosis not present

## 2020-05-06 DIAGNOSIS — F902 Attention-deficit hyperactivity disorder, combined type: Secondary | ICD-10-CM | POA: Diagnosis not present

## 2020-05-07 MED FILL — ATORVASTATIN 40 MG TABLET: 40 | 90 days supply | Qty: 90 | Fill #0

## 2020-05-07 MED FILL — LEVOTHYROXINE 88 MCG TABLET: 88 | 90 days supply | Qty: 90 | Fill #0

## 2020-05-12 ENCOUNTER — Encounter (INDEPENDENT_AMBULATORY_CARE_PROVIDER_SITE_OTHER): Payer: Self-pay | Admitting: Family Medicine

## 2020-05-12 ENCOUNTER — Ambulatory Visit (INDEPENDENT_AMBULATORY_CARE_PROVIDER_SITE_OTHER): Payer: 59 | Admitting: Family Medicine

## 2020-05-12 ENCOUNTER — Other Ambulatory Visit: Payer: Self-pay

## 2020-05-12 VITALS — BP 101/70 | HR 73 | Temp 98.7°F | Ht 74.0 in | Wt 235.0 lb

## 2020-05-12 DIAGNOSIS — R7303 Prediabetes: Secondary | ICD-10-CM | POA: Diagnosis not present

## 2020-05-12 DIAGNOSIS — E669 Obesity, unspecified: Secondary | ICD-10-CM | POA: Diagnosis not present

## 2020-05-12 DIAGNOSIS — Z6830 Body mass index (BMI) 30.0-30.9, adult: Secondary | ICD-10-CM | POA: Diagnosis not present

## 2020-05-12 DIAGNOSIS — Z9189 Other specified personal risk factors, not elsewhere classified: Secondary | ICD-10-CM

## 2020-05-12 MED ORDER — METFORMIN HCL 500 MG PO TABS: 500.0000 mg | ORAL_TABLET | Freq: Two times a day (BID) | ORAL | 0 refills | Status: DC

## 2020-05-12 MED ORDER — SAXENDA 18 MG/3ML ~~LOC~~ SOPN: PEN_INJECTOR | SUBCUTANEOUS | 0 refills | Status: DC

## 2020-05-12 MED ORDER — UNIFINE PENTIPS 32G X 4 MM MISC: 0 refills | Status: DC

## 2020-05-12 MED FILL — METFORMIN HCL 500 MG TABS: 500 | 30 days supply | Qty: 60 | Fill #0

## 2020-05-12 MED FILL — SAXENDA 18 MG/3 ML PEN: 18 | 30 days supply | Qty: 15 | Fill #0

## 2020-05-12 MED FILL — ADDERALL XR 20 MG CAP SA: 20 | 30 days supply | Qty: 30 | Fill #0

## 2020-05-12 NOTE — Progress Notes (Signed)
Chief Complaint:   OBESITY Brian Cunningham is here to discuss his progress with his obesity treatment plan along with follow-up of his obesity related diagnoses. Brian Cunningham is on the Category 4 Plan and states he is following his eating plan approximately 40% of the time. Brian Cunningham states he is running, walking, weights daily 5 times per week.  Today's visit was #: 14 Starting weight: 262 lbs Starting date: 03/12/2019 Today's weight: 235 lbs Today's date: 05/12/2020 Total lbs lost to date: 27 Total lbs lost since last in-office visit: 5  Interim History: Brian Cunningham continues to do well with weight loss, but he struggles with meal planning and prepping especially with increased work stress and hours.  Subjective:   1. Pre-diabetes Brian Cunningham is stable on metformin, and he denies nausea or vomiting. He is working on diet and exercise.  2. At risk for heart disease Brian Cunningham is at a higher than average risk for cardiovascular disease due to obesity.   Assessment/Plan:   1. Pre-diabetes Brian Cunningham will continue to work on weight loss, diet, exercise, and decreasing simple carbohydrates to help decrease the risk of diabetes. We will refill metformin for 1 month, and will follow closely.  - metFORMIN (GLUCOPHAGE) 500 MG tablet; Take 1 tablet (500 mg total) by mouth 2 (two) times daily with a meal.  Dispense: 60 tablet; Refill: 0  2. At risk for heart disease Brian Cunningham was given approximately 15 minutes of coronary artery disease prevention counseling today. He is 49 y.o. male and has risk factors for heart disease including obesity. We discussed intensive lifestyle modifications today with an emphasis on specific weight loss instructions and strategies.   Repetitive spaced learning was employed today to elicit superior memory formation and behavioral change.  3. Class 1 obesity with serious comorbidity and body mass index (BMI) of 30.0 to 30.9 in adult, unspecified obesity  type Brian Cunningham is currently in the action stage of change. As such, his goal is to continue with weight loss efforts. He has agreed to the Category 4 Plan.   We discussed various medication options to help Brian Cunningham with his weight loss efforts and we both agreed to continue Saxenda and we will refill for 1 month, and will refill nano needles #100 with no refill.  - Liraglutide -Weight Management (SAXENDA) 18 MG/3ML SOPN; INJECT 3MG  AS DIRECTED DAILY  Dispense: 15 mL; Refill: 0 - Insulin Pen Needle (UNIFINE PENTIPS) 32G X 4 MM MISC; USE WITH SAXENDA DAILY  Dispense: 100 each; Refill: 0  Exercise goals: As is.  Behavioral modification strategies: increasing lean protein intake, no skipping meals and meal planning and cooking strategies.  Brian Cunningham has agreed to follow-up with our clinic in 4 weeks. He was informed of the importance of frequent follow-up visits to maximize his success with intensive lifestyle modifications for his multiple health conditions.   Objective:   Blood pressure 101/70, pulse 73, temperature 98.7 F (37.1 C), height 6\' 2"  (1.88 m), weight 235 lb (106.6 kg), SpO2 95 %. Body mass index is 30.17 kg/m.  General: Cooperative, alert, well developed, in no acute distress. HEENT: Conjunctivae and lids unremarkable. Cardiovascular: Regular rhythm.  Lungs: Normal work of breathing. Neurologic: No focal deficits.   Lab Results  Component Value Date   CREATININE 0.95 12/24/2019   BUN 16 12/24/2019   NA 142 12/24/2019   K 4.5 12/24/2019   CL 99 12/24/2019   CO2 30 (H) 12/24/2019   Lab Results  Component Value Date   ALT 38 12/24/2019  AST 26 12/24/2019   ALKPHOS 58 12/24/2019   BILITOT 0.7 12/24/2019   Lab Results  Component Value Date   HGBA1C 4.8 12/24/2019   HGBA1C 4.8 08/13/2019   HGBA1C 5.2 03/12/2019   Lab Results  Component Value Date   INSULIN 13.0 12/24/2019   INSULIN 18.1 08/13/2019   INSULIN 31.1 (H) 03/12/2019   Lab Results    Component Value Date   TSH 1.990 08/13/2019   Lab Results  Component Value Date   CHOL 149 12/24/2019   HDL 42 12/24/2019   LDLCALC 91 12/24/2019   TRIG 81 12/24/2019   Lab Results  Component Value Date   WBC 5.4 03/12/2019   HGB 16.9 03/12/2019   HCT 50.1 03/12/2019   MCV 101 (H) 03/12/2019   No results found for: IRON, TIBC, FERRITIN  Attestation Statements:   Reviewed by clinician on day of visit: allergies, medications, problem list, medical history, surgical history, family history, social history, and previous encounter notes.   I, Trixie Dredge, am acting as transcriptionist for Dennard Nip, MD.  I have reviewed the above documentation for accuracy and completeness, and I agree with the above. -  Dennard Nip, MD

## 2020-05-13 ENCOUNTER — Other Ambulatory Visit (HOSPITAL_COMMUNITY): Payer: Self-pay | Admitting: Family Medicine

## 2020-05-18 MED FILL — traZODone HCL 100 MG TABS: 100 | 90 days supply | Qty: 90 | Fill #0

## 2020-05-22 MED FILL — LATUDA 40 MG TABLET: 40 | 90 days supply | Qty: 90 | Fill #0

## 2020-05-22 MED FILL — DIVALPROEX SOD ER 500 MG TA: 500 | 90 days supply | Qty: 180 | Fill #0

## 2020-06-01 ENCOUNTER — Other Ambulatory Visit (HOSPITAL_COMMUNITY): Payer: Self-pay

## 2020-06-01 MED FILL — MIRTAZAPINE 15 MG TABLET: 15 | 90 days supply | Qty: 90 | Fill #0

## 2020-06-02 ENCOUNTER — Other Ambulatory Visit (HOSPITAL_COMMUNITY): Payer: Self-pay | Admitting: Family Medicine

## 2020-06-02 MED FILL — SILDENAFIL CITRATE 100 MG T: 100 | 30 days supply | Qty: 6 | Fill #0

## 2020-06-09 ENCOUNTER — Other Ambulatory Visit (HOSPITAL_COMMUNITY): Payer: Self-pay

## 2020-06-10 ENCOUNTER — Encounter (INDEPENDENT_AMBULATORY_CARE_PROVIDER_SITE_OTHER): Payer: Self-pay | Admitting: Family Medicine

## 2020-06-10 ENCOUNTER — Ambulatory Visit (INDEPENDENT_AMBULATORY_CARE_PROVIDER_SITE_OTHER): Payer: 59 | Admitting: Family Medicine

## 2020-06-10 ENCOUNTER — Other Ambulatory Visit: Payer: Self-pay

## 2020-06-10 ENCOUNTER — Other Ambulatory Visit (INDEPENDENT_AMBULATORY_CARE_PROVIDER_SITE_OTHER): Payer: Self-pay | Admitting: Family Medicine

## 2020-06-10 VITALS — BP 103/71 | HR 67 | Temp 97.9°F | Ht 74.0 in | Wt 230.0 lb

## 2020-06-10 DIAGNOSIS — R7303 Prediabetes: Secondary | ICD-10-CM | POA: Diagnosis not present

## 2020-06-10 DIAGNOSIS — E559 Vitamin D deficiency, unspecified: Secondary | ICD-10-CM

## 2020-06-10 DIAGNOSIS — Z6830 Body mass index (BMI) 30.0-30.9, adult: Secondary | ICD-10-CM | POA: Diagnosis not present

## 2020-06-10 DIAGNOSIS — E7849 Other hyperlipidemia: Secondary | ICD-10-CM | POA: Diagnosis not present

## 2020-06-10 DIAGNOSIS — E669 Obesity, unspecified: Secondary | ICD-10-CM

## 2020-06-10 DIAGNOSIS — Z9189 Other specified personal risk factors, not elsewhere classified: Secondary | ICD-10-CM

## 2020-06-10 DIAGNOSIS — F3160 Bipolar disorder, current episode mixed, unspecified: Secondary | ICD-10-CM

## 2020-06-10 DIAGNOSIS — E66811 Obesity, class 1: Secondary | ICD-10-CM

## 2020-06-10 DIAGNOSIS — E038 Other specified hypothyroidism: Secondary | ICD-10-CM

## 2020-06-10 MED ORDER — UNIFINE PENTIPS 32G X 4 MM MISC: 0 refills | Status: DC

## 2020-06-10 MED ORDER — SAXENDA 18 MG/3ML ~~LOC~~ SOPN: PEN_INJECTOR | SUBCUTANEOUS | 0 refills | Status: DC

## 2020-06-10 MED FILL — UNIFINE PENTIPS 32GX5/32: 32G X 4 MM | 90 days supply | Qty: 100 | Fill #0

## 2020-06-10 MED FILL — SAXENDA 18 MG/3 ML PEN: 18 | 30 days supply | Qty: 15 | Fill #0

## 2020-06-11 LAB — COMPREHENSIVE METABOLIC PANEL
ALT: 32 IU/L (ref 0–44)
AST: 28 IU/L (ref 0–40)
Albumin/Globulin Ratio: 2.4 — ABNORMAL HIGH (ref 1.2–2.2)
Albumin: 4.4 g/dL (ref 4.0–5.0)
Alkaline Phosphatase: 53 IU/L (ref 44–121)
BUN/Creatinine Ratio: 16 (ref 9–20)
BUN: 16 mg/dL (ref 6–24)
Bilirubin Total: 0.7 mg/dL (ref 0.0–1.2)
CO2: 29 mmol/L (ref 20–29)
Calcium: 9.8 mg/dL (ref 8.7–10.2)
Chloride: 95 mmol/L — ABNORMAL LOW (ref 96–106)
Creatinine, Ser: 0.97 mg/dL (ref 0.76–1.27)
GFR calc Af Amer: 106 mL/min/{1.73_m2} (ref >59)
GFR calc non Af Amer: 92 mL/min/{1.73_m2} (ref >59)
Globulin, Total: 1.8 g/dL (ref 1.5–4.5)
Glucose: 77 mg/dL (ref 65–99)
Potassium: 4.4 mmol/L (ref 3.5–5.2)
Sodium: 138 mmol/L (ref 134–144)
Total Protein: 6.2 g/dL (ref 6.0–8.5)

## 2020-06-11 LAB — VALPROIC ACID LEVEL: Valproic Acid Lvl: 50 ug/mL (ref 50–100)

## 2020-06-11 LAB — T3: T3, Total: 108 ng/dL (ref 71–180)

## 2020-06-11 LAB — LIPID PANEL WITH LDL/HDL RATIO
Cholesterol, Total: 129 mg/dL (ref 100–199)
HDL: 41 mg/dL (ref >39)
LDL Chol Calc (NIH): 72 mg/dL (ref 0–99)
LDL/HDL Ratio: 1.8 ratio (ref 0.0–3.6)
Triglycerides: 78 mg/dL (ref 0–149)
VLDL Cholesterol Cal: 16 mg/dL (ref 5–40)

## 2020-06-11 LAB — TSH: TSH: 3.51 u[IU]/mL (ref 0.450–4.500)

## 2020-06-11 LAB — VITAMIN D 25 HYDROXY (VIT D DEFICIENCY, FRACTURES): Vit D, 25-Hydroxy: 107 ng/mL — ABNORMAL HIGH (ref 30.0–100.0)

## 2020-06-11 LAB — HEMOGLOBIN A1C
Est. average glucose Bld gHb Est-mCnc: 100 mg/dL
Hgb A1c MFr Bld: 5.1 % (ref 4.8–5.6)

## 2020-06-11 LAB — INSULIN, RANDOM: INSULIN: 9.6 u[IU]/mL (ref 2.6–24.9)

## 2020-06-11 LAB — T4, FREE: Free T4: 1.84 ng/dL — ABNORMAL HIGH (ref 0.82–1.77)

## 2020-06-11 NOTE — Progress Notes (Signed)
Please ask pt to dc all vitamin d till our next visit as his level is too high.

## 2020-06-16 DIAGNOSIS — G4700 Insomnia, unspecified: Secondary | ICD-10-CM | POA: Diagnosis not present

## 2020-06-16 DIAGNOSIS — F3132 Bipolar disorder, current episode depressed, moderate: Secondary | ICD-10-CM | POA: Diagnosis not present

## 2020-06-16 DIAGNOSIS — F902 Attention-deficit hyperactivity disorder, combined type: Secondary | ICD-10-CM | POA: Diagnosis not present

## 2020-06-22 ENCOUNTER — Other Ambulatory Visit (INDEPENDENT_AMBULATORY_CARE_PROVIDER_SITE_OTHER): Payer: Self-pay

## 2020-06-22 ENCOUNTER — Other Ambulatory Visit (INDEPENDENT_AMBULATORY_CARE_PROVIDER_SITE_OTHER): Payer: Self-pay | Admitting: Family Medicine

## 2020-06-22 DIAGNOSIS — E7849 Other hyperlipidemia: Secondary | ICD-10-CM | POA: Insufficient documentation

## 2020-06-22 DIAGNOSIS — R7303 Prediabetes: Secondary | ICD-10-CM | POA: Insufficient documentation

## 2020-06-22 DIAGNOSIS — E038 Other specified hypothyroidism: Secondary | ICD-10-CM | POA: Insufficient documentation

## 2020-06-22 MED ORDER — METFORMIN HCL 500 MG PO TABS: 500.0000 mg | ORAL_TABLET | Freq: Two times a day (BID) | ORAL | 0 refills | Status: DC

## 2020-06-22 MED FILL — METFORMIN HCL 500 MG TABS: 500 | 30 days supply | Qty: 60 | Fill #0

## 2020-06-22 NOTE — Progress Notes (Signed)
Chief Complaint:   OBESITY Brian Cunningham is here to discuss his progress with his obesity treatment plan along with follow-up of his obesity related diagnoses. Brian Cunningham is on the Category 4 Plan and states he is following his eating plan approximately 50% of the time. Brian Cunningham states he is doing exercises at work.  Today's visit was #: 15 Starting weight: 262 lbs Starting date: 03/12/2019 Today's weight: 230 lbs Today's date: 06/10/2020 Total lbs lost to date: 32 Total lbs lost since last in-office visit: 5  Interim History: Brian Cunningham continues to do well with weight loss even with extra challenges lately. He is doing well with his medications. He denies nausea or vomiting.  Subjective:   1. Pre-diabetes Brian Cunningham is doing very well with diet and weight loss. He denies hypoglycemia.  2. Vitamin D deficiency Brian Cunningham is stable on Vit D, and he is due for labs. He denies nausea or vomiting.  3. Other hyperlipidemia Brian Cunningham is doing well on diet and with exercise. He is due for labs.  4. Other specified hypothyroidism Brian Cunningham is stable on his medications, and he is due for labs. He denies tachycardia or tremors.  5. Bipolar 1 disorder, mixed (Brian Cunningham) Brian Cunningham noted a mild flare up of his bipolar I disorder, but he is able to get his symptoms under control with improved sleep and tweaks to his medications. This does affect his eating somewhat but not significantly.  6. At risk for complication associated with hypotension The patient is at a higher than average risk of hypotension due to weight loss.  Assessment/Plan:   1. Pre-diabetes Brian Cunningham will continue to work on weight loss, exercise, and decreasing simple carbohydrates to help decrease the risk of diabetes. We will check labs today, and we will refill metformin 500 mg BID for 1 month.   - Comprehensive metabolic panel - Insulin, random - Hemoglobin A1c  2. Vitamin D deficiency Low Vitamin D  level contributes to fatigue and are associated with obesity, breast, and colon cancer. We will check labs today, and Brian Cunningham will follow-up for routine testing of Vitamin D, at least 2-3 times per year to avoid over-replacement.  - VITAMIN D 25 Hydroxy (Vit-D Deficiency, Fractures)  3. Other hyperlipidemia Cardiovascular risk and specific lipid/LDL goals reviewed. We discussed several lifestyle modifications today. Brian Cunningham will continue to work on diet, exercise and weight loss efforts. We will check labs today. Orders and follow up as documented in patient record.   - Lipid Panel With LDL/HDL Ratio  4. Other specified hypothyroidism Patient with long-standing hypothyroidism, on levothyroxine therapy. Brian Cunningham appears euthyroid. We will check labs today. Orders and follow up as documented in patient record.  - T3 - T4, free - TSH  5. Bipolar 1 disorder, mixed (Brian Cunningham) We will check labs today, and will continue to monitor. Brian Cunningham will follow up as directed.  - Valproic Acid level  6. At risk for complication associated with hypotension Brian Cunningham was given approximately 15 minutes of education and counseling today to help avoid hypotension. We discussed risks of hypotension with weight loss and signs of hypotension such as feeling lightheaded or unsteady.  Repetitive spaced learning was employed today to elicit superior memory formation and behavioral change.  7. Class 1 obesity with serious comorbidity and body mass index (BMI) of 30.0 to 30.9 in adult, unspecified obesity type Brian Cunningham is currently in the action stage of change. As such, his goal is to continue with weight loss efforts. He has agreed to the Category 4 Plan.  We discussed various medication options to help Brian Cunningham with his weight loss efforts and we both agreed to continue Saxenda, and we will refill Saxenda for 1 month, and will refill pen needles ##100, with no refill.  - Liraglutide -Weight  Management (SAXENDA) 18 MG/3ML SOPN; INJECT 3MG  AS DIRECTED DAILY  Dispense: 15 mL; Refill: 0 - Insulin Pen Needle (UNIFINE PENTIPS) 32G X 4 MM MISC; USE WITH SAXENDA DAILY  Dispense: 100 each; Refill: 0  Exercise goals: As is.  Behavioral modification strategies: holiday eating strategies .  Brian Cunningham has agreed to follow-up with our clinic in 4 weeks. He was informed of the importance of frequent follow-up visits to maximize his success with intensive lifestyle modifications for his multiple health conditions.   Brian Cunningham was informed we would discuss his lab results at his next visit unless there is a critical issue that needs to be addressed sooner. Brian Cunningham agreed to keep his next visit at the agreed upon time to discuss these results.  Objective:   Blood pressure 103/71, pulse 67, temperature 97.9 F (36.6 C), height 6\' 2"  (1.88 m), weight 230 lb (104.3 kg), SpO2 97 %. Body mass index is 29.53 kg/m.  General: Cooperative, alert, well developed, in no acute distress. HEENT: Conjunctivae and lids unremarkable. Cardiovascular: Regular rhythm.  Lungs: Normal work of breathing. Neurologic: No focal deficits.   Lab Results  Component Value Date   CREATININE 0.97 06/10/2020   BUN 16 06/10/2020   NA 138 06/10/2020   K 4.4 06/10/2020   CL 95 (L) 06/10/2020   CO2 29 06/10/2020   Lab Results  Component Value Date   ALT 32 06/10/2020   AST 28 06/10/2020   ALKPHOS 53 06/10/2020   BILITOT 0.7 06/10/2020   Lab Results  Component Value Date   HGBA1C 5.1 06/10/2020   HGBA1C 4.8 12/24/2019   HGBA1C 4.8 08/13/2019   HGBA1C 5.2 03/12/2019   Lab Results  Component Value Date   INSULIN 9.6 06/10/2020   INSULIN 13.0 12/24/2019   INSULIN 18.1 08/13/2019   INSULIN 31.1 (H) 03/12/2019   Lab Results  Component Value Date   TSH 3.510 06/10/2020   Lab Results  Component Value Date   CHOL 129 06/10/2020   HDL 41 06/10/2020   LDLCALC 72 06/10/2020   TRIG 78 06/10/2020    Lab Results  Component Value Date   WBC 5.4 03/12/2019   HGB 16.9 03/12/2019   HCT 50.1 03/12/2019   MCV 101 (H) 03/12/2019   No results found for: IRON, TIBC, FERRITIN  Attestation Statements:   Reviewed by clinician on day of visit: allergies, medications, problem list, medical history, surgical history, family history, social history, and previous encounter notes.   I, Trixie Dredge, am acting as transcriptionist for Dennard Nip, MD.  I have reviewed the above documentation for accuracy and completeness, and I agree with the above. -  Dennard Nip, MD

## 2020-06-23 ENCOUNTER — Other Ambulatory Visit (INDEPENDENT_AMBULATORY_CARE_PROVIDER_SITE_OTHER): Payer: Self-pay | Admitting: Family Medicine

## 2020-06-23 DIAGNOSIS — R7303 Prediabetes: Secondary | ICD-10-CM

## 2020-06-25 MED FILL — ADDERALL XR 20 MG CAP SA: 20 | 30 days supply | Qty: 30 | Fill #0

## 2020-06-29 ENCOUNTER — Other Ambulatory Visit (HOSPITAL_COMMUNITY): Payer: Self-pay

## 2020-06-29 MED FILL — VENLAFAXINE HCL ER 75 MG CA: 75 | 90 days supply | Qty: 90 | Fill #0

## 2020-07-08 ENCOUNTER — Encounter (INDEPENDENT_AMBULATORY_CARE_PROVIDER_SITE_OTHER): Payer: Self-pay | Admitting: Family Medicine

## 2020-07-08 ENCOUNTER — Other Ambulatory Visit (INDEPENDENT_AMBULATORY_CARE_PROVIDER_SITE_OTHER): Payer: Self-pay | Admitting: Family Medicine

## 2020-07-08 ENCOUNTER — Other Ambulatory Visit: Payer: Self-pay

## 2020-07-08 ENCOUNTER — Ambulatory Visit (INDEPENDENT_AMBULATORY_CARE_PROVIDER_SITE_OTHER): Payer: 59 | Admitting: Family Medicine

## 2020-07-08 VITALS — BP 113/72 | HR 75 | Temp 98.0°F | Ht 74.0 in | Wt 226.0 lb

## 2020-07-08 DIAGNOSIS — E66811 Obesity, class 1: Secondary | ICD-10-CM

## 2020-07-08 DIAGNOSIS — E88819 Insulin resistance, unspecified: Secondary | ICD-10-CM

## 2020-07-08 DIAGNOSIS — Z9189 Other specified personal risk factors, not elsewhere classified: Secondary | ICD-10-CM

## 2020-07-08 DIAGNOSIS — Z6830 Body mass index (BMI) 30.0-30.9, adult: Secondary | ICD-10-CM

## 2020-07-08 DIAGNOSIS — E669 Obesity, unspecified: Secondary | ICD-10-CM

## 2020-07-08 DIAGNOSIS — E8881 Metabolic syndrome: Secondary | ICD-10-CM

## 2020-07-08 MED ORDER — UNIFINE PENTIPS 32G X 4 MM MISC: 0 refills | Status: DC

## 2020-07-08 MED ORDER — SAXENDA 18 MG/3ML ~~LOC~~ SOPN: PEN_INJECTOR | SUBCUTANEOUS | 0 refills | Status: DC

## 2020-07-08 MED ORDER — METFORMIN HCL 500 MG PO TABS: 500.0000 mg | ORAL_TABLET | Freq: Two times a day (BID) | ORAL | 0 refills | Status: DC

## 2020-07-08 MED FILL — SAXENDA 18 MG/3 ML PEN: 18 | 30 days supply | Qty: 15 | Fill #0

## 2020-07-09 NOTE — Progress Notes (Signed)
Chief Complaint:   OBESITY Evian is here to discuss his progress with his obesity treatment plan along with follow-up of his obesity related diagnoses. Radford is on the Category 4 Plan and states he is following his eating plan approximately 50% of the time. Roberts states he is active only at work.  Today's visit was #: 16 Starting weight: 262 lbs Starting date: 03/12/2019 Today's weight: 226 lbs Today's date: 07/08/2020 Total lbs lost to date: 36 Total lbs lost since last in-office visit: 4  Interim History: Eva continues to do well with weight loss. He is tolerating Saxenda well and his hunger is controlled. He struggles to add in strengthening exercise however.  Subjective:   1. Insulin resistance Harrel continues to do well with his medications, and he notes minimal GI upset with metformin or Saxenda.  2. At risk for complication associated with hypotension The patient is at a higher than average risk of hypotension due to weight loss and Saxenda.  Assessment/Plan:   1. Insulin resistance Reyansh will continue to work on weight loss, increasing exercise, and decreasing simple carbohydrates to help decrease the risk of diabetes. we will refill metformin for 1 month. Thomas agreed to follow-up with [name] as directed to closely monitor his progress.  - metFORMIN (GLUCOPHAGE) 500 MG tablet; Take 1 tablet (500 mg total) by mouth 2 (two) times daily with a meal.  Dispense: 60 tablet; Refill: 0  2. At risk for complication associated with hypotension Raad was given approximately 15 minutes of education and counseling today to help avoid hypotension. We discussed risks of hypotension with weight loss and signs of hypotension such as feeling lightheaded or unsteady.  Repetitive spaced learning was employed today to elicit superior memory formation and behavioral change.  3. Class 1 obesity with serious comorbidity and body mass index  (BMI) of 30.0 to 30.9 in adult, unspecified obesity type Isay is currently in the action stage of change. As such, his goal is to continue with weight loss efforts. He has agreed to the Category 4 Plan.   We discussed various medication options to help Lorik with his weight loss efforts and we both agreed to continue Saxenda, and we will refill for 1 month, and we will refill nano needles #100 with no refills.  - Liraglutide -Weight Management (SAXENDA) 18 MG/3ML SOPN; INJECT 3MG  AS DIRECTED DAILY  Dispense: 15 mL; Refill: 0 - Insulin Pen Needle (UNIFINE PENTIPS) 32G X 4 MM MISC; USE WITH SAXENDA DAILY  Dispense: 100 each; Refill: 0  Exercise goals: For substantial health benefits, adults should do at least 150 minutes (2 hours and 30 minutes) a week of moderate-intensity, or 75 minutes (1 hour and 15 minutes) a week of vigorous-intensity aerobic physical activity, or an equivalent combination of moderate- and vigorous-intensity aerobic activity. Aerobic activity should be performed in episodes of at least 10 minutes, and preferably, it should be spread throughout the week.  Behavioral modification strategies: increasing water intake and holiday eating strategies .  Mohmed has agreed to follow-up with our clinic in 3 to 4 weeks. He was informed of the importance of frequent follow-up visits to maximize his success with intensive lifestyle modifications for his multiple health conditions.   Objective:   Blood pressure 113/72, pulse 75, temperature 98 F (36.7 C), height 6\' 2"  (1.88 m), weight 226 lb (102.5 kg), SpO2 95 %. Body mass index is 29.02 kg/m.  General: Cooperative, alert, well developed, in no acute distress. HEENT: Conjunctivae and lids  unremarkable. Cardiovascular: Regular rhythm.  Lungs: Normal work of breathing. Neurologic: No focal deficits.   Lab Results  Component Value Date   CREATININE 0.97 06/10/2020   BUN 16 06/10/2020   NA 138 06/10/2020   K 4.4  06/10/2020   CL 95 (L) 06/10/2020   CO2 29 06/10/2020   Lab Results  Component Value Date   ALT 32 06/10/2020   AST 28 06/10/2020   ALKPHOS 53 06/10/2020   BILITOT 0.7 06/10/2020   Lab Results  Component Value Date   HGBA1C 5.1 06/10/2020   HGBA1C 4.8 12/24/2019   HGBA1C 4.8 08/13/2019   HGBA1C 5.2 03/12/2019   Lab Results  Component Value Date   INSULIN 9.6 06/10/2020   INSULIN 13.0 12/24/2019   INSULIN 18.1 08/13/2019   INSULIN 31.1 (H) 03/12/2019   Lab Results  Component Value Date   TSH 3.510 06/10/2020   Lab Results  Component Value Date   CHOL 129 06/10/2020   HDL 41 06/10/2020   LDLCALC 72 06/10/2020   TRIG 78 06/10/2020   Lab Results  Component Value Date   WBC 5.4 03/12/2019   HGB 16.9 03/12/2019   HCT 50.1 03/12/2019   MCV 101 (H) 03/12/2019   No results found for: IRON, TIBC, FERRITIN  Attestation Statements:   Reviewed by clinician on day of visit: allergies, medications, problem list, medical history, surgical history, family history, social history, and previous encounter notes.   I, Trixie Dredge, am acting as transcriptionist for Dennard Nip, MD.  I have reviewed the above documentation for accuracy and completeness, and I agree with the above. -  Dennard Nip, MD

## 2020-07-10 MED FILL — SILDENAFIL CITRATE 100 MG T: 100 | 30 days supply | Qty: 6 | Fill #1

## 2020-07-10 MED FILL — OMEPRAZOLE 40 MG CPDR: 40 | 90 days supply | Qty: 90 | Fill #1

## 2020-07-16 ENCOUNTER — Other Ambulatory Visit (HOSPITAL_COMMUNITY): Payer: Self-pay

## 2020-07-16 MED FILL — NARCAN 4 MG NASAL SPRAY: 4 | 15 days supply | Qty: 2 | Fill #0

## 2020-07-20 MED FILL — METFORMIN HCL 500 MG TABS: 500 | 30 days supply | Qty: 60 | Fill #0

## 2020-07-28 DIAGNOSIS — F3130 Bipolar disorder, current episode depressed, mild or moderate severity, unspecified: Secondary | ICD-10-CM | POA: Diagnosis not present

## 2020-08-03 MED FILL — LEVOTHYROXINE 88 MCG TABLET: 88 | 90 days supply | Qty: 90 | Fill #0

## 2020-08-03 MED FILL — SILDENAFIL CITRATE 100 MG T: 100 | 30 days supply | Qty: 6 | Fill #2

## 2020-08-03 MED FILL — ATORVASTATIN 40 MG TABLET: 40 | 90 days supply | Qty: 90 | Fill #0

## 2020-08-04 DIAGNOSIS — M26622 Arthralgia of left temporomandibular joint: Secondary | ICD-10-CM | POA: Diagnosis not present

## 2020-08-04 DIAGNOSIS — F902 Attention-deficit hyperactivity disorder, combined type: Secondary | ICD-10-CM | POA: Diagnosis not present

## 2020-08-04 DIAGNOSIS — F3132 Bipolar disorder, current episode depressed, moderate: Secondary | ICD-10-CM | POA: Diagnosis not present

## 2020-08-04 DIAGNOSIS — G4700 Insomnia, unspecified: Secondary | ICD-10-CM | POA: Diagnosis not present

## 2020-08-05 ENCOUNTER — Other Ambulatory Visit: Payer: Self-pay

## 2020-08-05 ENCOUNTER — Encounter (INDEPENDENT_AMBULATORY_CARE_PROVIDER_SITE_OTHER): Payer: Self-pay | Admitting: Family Medicine

## 2020-08-05 ENCOUNTER — Ambulatory Visit (INDEPENDENT_AMBULATORY_CARE_PROVIDER_SITE_OTHER): Payer: 59 | Admitting: Family Medicine

## 2020-08-05 ENCOUNTER — Other Ambulatory Visit (INDEPENDENT_AMBULATORY_CARE_PROVIDER_SITE_OTHER): Payer: Self-pay | Admitting: Family Medicine

## 2020-08-05 VITALS — BP 105/68 | HR 74 | Temp 98.4°F | Ht 74.0 in | Wt 226.0 lb

## 2020-08-05 DIAGNOSIS — R7303 Prediabetes: Secondary | ICD-10-CM

## 2020-08-05 DIAGNOSIS — Z6830 Body mass index (BMI) 30.0-30.9, adult: Secondary | ICD-10-CM | POA: Diagnosis not present

## 2020-08-05 DIAGNOSIS — E669 Obesity, unspecified: Secondary | ICD-10-CM

## 2020-08-05 MED ORDER — METFORMIN HCL 500 MG PO TABS: 500.0000 mg | ORAL_TABLET | Freq: Two times a day (BID) | ORAL | 0 refills | Status: DC

## 2020-08-05 MED ORDER — UNIFINE PENTIPS 32G X 4 MM MISC: 0 refills | Status: DC

## 2020-08-05 MED ORDER — SAXENDA 18 MG/3ML ~~LOC~~ SOPN: PEN_INJECTOR | SUBCUTANEOUS | 0 refills | Status: DC

## 2020-08-05 MED FILL — SAXENDA 18 MG/3 ML PEN: 18 | 30 days supply | Qty: 15 | Fill #0

## 2020-08-06 NOTE — Progress Notes (Signed)
Chief Complaint:   OBESITY Brian Cunningham is here to discuss his progress with his obesity treatment plan along with follow-up of his obesity related diagnoses. Brian Cunningham is on the Category 4 Plan and states he is following his eating plan approximately 40% of the time. Brian Cunningham states he is doing strength training for 20 minutes 3 times per week.  Today's visit was #: 10 Starting weight: 262 lbs Starting date: 03/12/2019 Today's weight: 226 lbs Today's date: 08/05/2020 Total lbs lost to date: 36 Total lbs lost since last in-office visit: 0  Interim History: Brian Cunningham has done very well avoiding holiday weight gain. He has had very significant family stress, and he hasn't always been able to concentrate on weight loss but he was still mindful and tried to portion control. He is tolerating Saxenda well.  Subjective:   1. Pre-diabetes Brian Cunningham is stable on metformin and GLP-1. He denies nausea, vomiting, or hypoglycemia. He is doing well avoiding weight gain and he has started strengthening exercises.  Assessment/Plan:   1. Pre-diabetes Brian Cunningham will continue to work on weight loss, exercise, and decreasing simple carbohydrates to help decrease the risk of diabetes. We will refill metformin for 1 month, and will continue to monitor.  - metFORMIN (GLUCOPHAGE) 500 MG tablet; Take 1 tablet (500 mg total) by mouth 2 (two) times daily with a meal.  Dispense: 60 tablet; Refill: 0  2. Class 1 obesity with serious comorbidity and body mass index (BMI) of 30.0 to 30.9 in adult, unspecified obesity type Brian Cunningham is currently in the action stage of change. As such, his goal is to continue with weight loss efforts. He has agreed to the Category 4 Plan.   We discussed various medication options to help Brian Cunningham with his weight loss efforts and we both agreed to continue Saxenda and we will refill for 1 month, and we will refill nano needles #100 with no refills.  - Liraglutide  -Weight Management (SAXENDA) 18 MG/3ML SOPN; INJECT 3MG  AS DIRECTED DAILY  Dispense: 15 mL; Refill: 0 - Insulin Pen Needle (UNIFINE PENTIPS) 32G X 4 MM MISC; USE WITH SAXENDA DAILY  Dispense: 100 each; Refill: 0  Exercise goals: As is.  Behavioral modification strategies: emotional eating strategies and holiday eating strategies .  Brian Cunningham has agreed to follow-up with our clinic in 4 weeks. He was informed of the importance of frequent follow-up visits to maximize his success with intensive lifestyle modifications for his multiple health conditions.   Objective:   Blood pressure 105/68, pulse 74, temperature 98.4 F (36.9 C), height 6\' 2"  (1.88 m), weight 226 lb (102.5 kg), SpO2 96 %. Body mass index is 29.02 kg/m.  General: Cooperative, alert, well developed, in no acute distress. HEENT: Conjunctivae and lids unremarkable. Cardiovascular: Regular rhythm.  Lungs: Normal work of breathing. Neurologic: No focal deficits.   Lab Results  Component Value Date   CREATININE 0.97 06/10/2020   BUN 16 06/10/2020   NA 138 06/10/2020   K 4.4 06/10/2020   CL 95 (L) 06/10/2020   CO2 29 06/10/2020   Lab Results  Component Value Date   ALT 32 06/10/2020   AST 28 06/10/2020   ALKPHOS 53 06/10/2020   BILITOT 0.7 06/10/2020   Lab Results  Component Value Date   HGBA1C 5.1 06/10/2020   HGBA1C 4.8 12/24/2019   HGBA1C 4.8 08/13/2019   HGBA1C 5.2 03/12/2019   Lab Results  Component Value Date   INSULIN 9.6 06/10/2020   INSULIN 13.0 12/24/2019   INSULIN  18.1 08/13/2019   INSULIN 31.1 (H) 03/12/2019   Lab Results  Component Value Date   TSH 3.510 06/10/2020   Lab Results  Component Value Date   CHOL 129 06/10/2020   HDL 41 06/10/2020   LDLCALC 72 06/10/2020   TRIG 78 06/10/2020   Lab Results  Component Value Date   WBC 5.4 03/12/2019   HGB 16.9 03/12/2019   HCT 50.1 03/12/2019   MCV 101 (H) 03/12/2019   No results found for: IRON, TIBC, FERRITIN  Attestation  Statements:   Reviewed by clinician on day of visit: allergies, medications, problem list, medical history, surgical history, family history, social history, and previous encounter notes.  Time spent on visit including pre-visit chart review and post-visit care and charting was 30 minutes.    I, Trixie Dredge, am acting as transcriptionist for Dennard Nip, MD.  I have reviewed the above documentation for accuracy and completeness, and I agree with the above. -  Gilman Olazabal [employer]

## 2020-08-10 MED FILL — LATUDA 40 MG TABLET: 40 | 90 days supply | Qty: 90 | Fill #0

## 2020-08-12 ENCOUNTER — Other Ambulatory Visit (HOSPITAL_COMMUNITY): Payer: Self-pay | Admitting: Family Medicine

## 2020-08-12 MED FILL — HYDROCODON-APAP 5-325: 5-325 | 4 days supply | Qty: 15 | Fill #0

## 2020-08-13 MED FILL — traZODone HCL 100 MG TABS: 100 | 90 days supply | Qty: 90 | Fill #0

## 2020-08-13 MED FILL — DIVALPROEX SOD ER 500 MG TA: 500 | 90 days supply | Qty: 180 | Fill #0

## 2020-08-13 MED FILL — METFORMIN HCL 500 MG TABS: 500 | 30 days supply | Qty: 60 | Fill #0

## 2020-08-17 ENCOUNTER — Other Ambulatory Visit (HOSPITAL_COMMUNITY): Payer: Self-pay

## 2020-08-17 MED FILL — PENICILLIN VK 500 MG TABLET: 500 | 7 days supply | Qty: 28 | Fill #0

## 2020-08-18 ENCOUNTER — Other Ambulatory Visit (HOSPITAL_COMMUNITY): Payer: Self-pay

## 2020-08-18 MED FILL — ADDERALL XR 20 MG CAP SA: 20 | 30 days supply | Qty: 30 | Fill #0

## 2020-08-24 MED FILL — MIRTAZAPINE 15 MG TABLET: 15 | 90 days supply | Qty: 90 | Fill #0

## 2020-08-27 DIAGNOSIS — K529 Noninfective gastroenteritis and colitis, unspecified: Secondary | ICD-10-CM | POA: Diagnosis not present

## 2020-08-31 ENCOUNTER — Other Ambulatory Visit (HOSPITAL_COMMUNITY): Payer: Self-pay | Admitting: Family Medicine

## 2020-08-31 DIAGNOSIS — M26609 Unspecified temporomandibular joint disorder, unspecified side: Secondary | ICD-10-CM | POA: Diagnosis not present

## 2020-08-31 DIAGNOSIS — R0683 Snoring: Secondary | ICD-10-CM | POA: Diagnosis not present

## 2020-08-31 MED FILL — SILDENAFIL CITRATE 100 MG T: 100 | 30 days supply | Qty: 6 | Fill #3

## 2020-08-31 MED FILL — HYDROCODON-APAP 7.5-325: 7.5-325 | 4 days supply | Qty: 15 | Fill #0

## 2020-09-02 ENCOUNTER — Other Ambulatory Visit (HOSPITAL_COMMUNITY): Payer: Self-pay

## 2020-09-02 MED FILL — CLINDAMYCIN HCL 300 MG CAPS: 300 | 7 days supply | Qty: 21 | Fill #0

## 2020-09-07 ENCOUNTER — Ambulatory Visit (INDEPENDENT_AMBULATORY_CARE_PROVIDER_SITE_OTHER): Payer: 59 | Admitting: Family Medicine

## 2020-09-09 ENCOUNTER — Other Ambulatory Visit (HOSPITAL_COMMUNITY): Payer: Self-pay | Admitting: Student in an Organized Health Care Education/Training Program

## 2020-09-09 DIAGNOSIS — M7918 Myalgia, other site: Secondary | ICD-10-CM | POA: Diagnosis not present

## 2020-09-09 DIAGNOSIS — G500 Trigeminal neuralgia: Secondary | ICD-10-CM | POA: Diagnosis not present

## 2020-09-09 DIAGNOSIS — M26622 Arthralgia of left temporomandibular joint: Secondary | ICD-10-CM | POA: Diagnosis not present

## 2020-09-09 MED FILL — GABAPENTIN 100 MG CAPSULE: 100 | 90 days supply | Qty: 270 | Fill #0

## 2020-09-10 ENCOUNTER — Other Ambulatory Visit (INDEPENDENT_AMBULATORY_CARE_PROVIDER_SITE_OTHER): Payer: Self-pay | Admitting: Family Medicine

## 2020-09-10 DIAGNOSIS — R7303 Prediabetes: Secondary | ICD-10-CM

## 2020-09-14 ENCOUNTER — Other Ambulatory Visit (INDEPENDENT_AMBULATORY_CARE_PROVIDER_SITE_OTHER): Payer: Self-pay | Admitting: Family Medicine

## 2020-09-14 DIAGNOSIS — R7303 Prediabetes: Secondary | ICD-10-CM

## 2020-09-14 NOTE — Telephone Encounter (Signed)
Last OV with Dr. Beasley 

## 2020-09-17 DIAGNOSIS — H527 Unspecified disorder of refraction: Secondary | ICD-10-CM | POA: Diagnosis not present

## 2020-09-17 DIAGNOSIS — H04123 Dry eye syndrome of bilateral lacrimal glands: Secondary | ICD-10-CM | POA: Diagnosis not present

## 2020-09-17 DIAGNOSIS — H1131 Conjunctival hemorrhage, right eye: Secondary | ICD-10-CM | POA: Diagnosis not present

## 2020-09-18 ENCOUNTER — Other Ambulatory Visit (HOSPITAL_COMMUNITY): Payer: Self-pay

## 2020-09-18 MED FILL — VENLAFAXINE HCL ER 75 MG CA: 75 | 90 days supply | Qty: 90 | Fill #0

## 2020-09-28 ENCOUNTER — Other Ambulatory Visit (HOSPITAL_COMMUNITY): Payer: Self-pay

## 2020-09-28 MED FILL — SILDENAFIL CITRATE 100 MG T: 100 | 30 days supply | Qty: 6 | Fill #4

## 2020-09-28 MED FILL — ADDERALL XR 20 MG CAP SA: 20 | 30 days supply | Qty: 30 | Fill #0

## 2020-09-29 ENCOUNTER — Other Ambulatory Visit (HOSPITAL_COMMUNITY): Payer: Self-pay | Admitting: Physician Assistant

## 2020-10-13 ENCOUNTER — Other Ambulatory Visit (HOSPITAL_COMMUNITY): Payer: Self-pay | Admitting: Student in an Organized Health Care Education/Training Program

## 2020-10-13 DIAGNOSIS — M26622 Arthralgia of left temporomandibular joint: Secondary | ICD-10-CM | POA: Diagnosis not present

## 2020-10-13 DIAGNOSIS — M7918 Myalgia, other site: Secondary | ICD-10-CM | POA: Diagnosis not present

## 2020-10-13 DIAGNOSIS — G501 Atypical facial pain: Secondary | ICD-10-CM | POA: Diagnosis not present

## 2020-10-13 MED FILL — GABAPENTIN 300 MG CAPSULE: 300 | 30 days supply | Qty: 90 | Fill #0

## 2020-10-19 DIAGNOSIS — M7918 Myalgia, other site: Secondary | ICD-10-CM | POA: Diagnosis not present

## 2020-10-22 ENCOUNTER — Other Ambulatory Visit (INDEPENDENT_AMBULATORY_CARE_PROVIDER_SITE_OTHER): Payer: Self-pay | Admitting: Family Medicine

## 2020-10-22 ENCOUNTER — Ambulatory Visit (INDEPENDENT_AMBULATORY_CARE_PROVIDER_SITE_OTHER): Payer: 59 | Admitting: Family Medicine

## 2020-10-22 ENCOUNTER — Encounter (INDEPENDENT_AMBULATORY_CARE_PROVIDER_SITE_OTHER): Payer: Self-pay | Admitting: Family Medicine

## 2020-10-22 ENCOUNTER — Other Ambulatory Visit: Payer: Self-pay

## 2020-10-22 VITALS — BP 104/73 | HR 78 | Temp 98.4°F | Ht 74.0 in | Wt 225.0 lb

## 2020-10-22 DIAGNOSIS — R7303 Prediabetes: Secondary | ICD-10-CM | POA: Diagnosis not present

## 2020-10-22 DIAGNOSIS — Z6830 Body mass index (BMI) 30.0-30.9, adult: Secondary | ICD-10-CM

## 2020-10-22 DIAGNOSIS — E669 Obesity, unspecified: Secondary | ICD-10-CM | POA: Diagnosis not present

## 2020-10-22 MED ORDER — SAXENDA 18 MG/3ML ~~LOC~~ SOPN: PEN_INJECTOR | SUBCUTANEOUS | 0 refills | Status: DC

## 2020-10-22 MED ORDER — UNIFINE PENTIPS 32G X 4 MM MISC
0 refills | Status: DC
Start: 2020-10-22 — End: 2020-11-25

## 2020-10-22 MED ORDER — METFORMIN HCL 500 MG PO TABS: 500.0000 mg | ORAL_TABLET | Freq: Two times a day (BID) | ORAL | 0 refills | Status: DC

## 2020-10-22 MED FILL — METFORMIN HCL 500 MG TABS: 500 | 30 days supply | Qty: 60 | Fill #0

## 2020-10-22 MED FILL — UNIFINE PENTIPS 32GX5/32: 32G X 4 MM | 90 days supply | Qty: 100 | Fill #0

## 2020-10-26 ENCOUNTER — Other Ambulatory Visit (HOSPITAL_COMMUNITY): Payer: Self-pay

## 2020-10-26 MED FILL — ATORVASTATIN 40 MG TABLET: 40 | 90 days supply | Qty: 90 | Fill #1

## 2020-10-26 MED FILL — LEVOTHYROXINE 88 MCG TABLET: 88 | 90 days supply | Qty: 90 | Fill #1

## 2020-10-26 MED FILL — OMEPRAZOLE 40 MG CPDR: 40 | 90 days supply | Qty: 90 | Fill #2

## 2020-10-26 MED FILL — SILDENAFIL CITRATE 100 MG T: 100 | 30 days supply | Qty: 6 | Fill #5

## 2020-10-26 NOTE — Progress Notes (Signed)
Chief Complaint:   OBESITY Brian Cunningham is here to discuss his progress with his obesity treatment plan along with follow-up of his obesity related diagnoses. Brian Cunningham is on the Category 4 Plan and states he is following his eating plan approximately 40% of the time. Brian Cunningham states he is doing 0 minutes 0 times per week.  Today's visit was #: 18 Starting weight: 262 lbs Starting date: 03/12/2019 Today's weight: 225 lbs Today's date: 10/22/2020 Total lbs lost to date: 37 Total lbs lost since last in-office visit: 1  Interim History: Brian Cunningham has had a lot of challenges in the last few weeks. He hasn't been able to concentrate on weight loss as much, but he has still done well overall. He is tolerating Saxenda well.  Subjective:   1. Pre-diabetes Brian Cunningham continues to do well with weight loss and with medications. He is tolerating metformin and GLP-1 well.  Assessment/Plan:   1. Pre-diabetes Brian Cunningham will continue to work on weight loss, exercise, and decreasing simple carbohydrates to help decrease the risk of diabetes. We will refill metformin for 1 month, and we will recheck labs in 1 month.  - metFORMIN (GLUCOPHAGE) 500 MG tablet; Take 1 tablet (500 mg total) by mouth 2 (two) times daily with a meal.  Dispense: 60 tablet; Refill: 0  2. Class 1 obesity with serious comorbidity and body mass index (BMI) of 30.0 to 30.9 in adult, unspecified obesity type Brian Cunningham is currently in the action stage of change. As such, his goal is to continue with weight loss efforts. He has agreed to the Category 4 Plan.   We will recheck fasting labs at his next visit.  We discussed various medication options to help Brian Cunningham with his weight loss efforts and we both agreed to continue Saxenda, and we will refill for 1 month, and we will refill pen needles #100 with no refills.  - Insulin Pen Needle (UNIFINE PENTIPS) 32G X 4 MM MISC; USE WITH SAXENDA DAILY  Dispense: 100 each;  Refill: 0 - Liraglutide -Weight Management (SAXENDA) 18 MG/3ML SOPN; INJECT 3MG  AS DIRECTED DAILY  Dispense: 15 mL; Refill: 0  Behavioral modification strategies: increasing lean protein intake and meal planning and cooking strategies.  Brian Cunningham has agreed to follow-up with our clinic in 4 weeks. He was informed of the importance of frequent follow-up visits to maximize his success with intensive lifestyle modifications for his multiple health conditions.   Objective:   Blood pressure 104/73, pulse 78, temperature 98.4 F (36.9 C), height 6\' 2"  (1.88 m), weight 225 lb (102.1 kg), SpO2 97 %. Body mass index is 28.89 kg/m.  General: Cooperative, alert, well developed, in no acute distress. HEENT: Conjunctivae and lids unremarkable. Cardiovascular: Regular rhythm.  Lungs: Normal work of breathing. Neurologic: No focal deficits.   Lab Results  Component Value Date   CREATININE 0.97 06/10/2020   BUN 16 06/10/2020   NA 138 06/10/2020   K 4.4 06/10/2020   CL 95 (L) 06/10/2020   CO2 29 06/10/2020   Lab Results  Component Value Date   ALT 32 06/10/2020   AST 28 06/10/2020   ALKPHOS 53 06/10/2020   BILITOT 0.7 06/10/2020   Lab Results  Component Value Date   HGBA1C 5.1 06/10/2020   HGBA1C 4.8 12/24/2019   HGBA1C 4.8 08/13/2019   HGBA1C 5.2 03/12/2019   Lab Results  Component Value Date   INSULIN 9.6 06/10/2020   INSULIN 13.0 12/24/2019   INSULIN 18.1 08/13/2019   INSULIN 31.1 (H) 03/12/2019  Lab Results  Component Value Date   TSH 3.510 06/10/2020   Lab Results  Component Value Date   CHOL 129 06/10/2020   HDL 41 06/10/2020   LDLCALC 72 06/10/2020   TRIG 78 06/10/2020   Lab Results  Component Value Date   WBC 5.4 03/12/2019   HGB 16.9 03/12/2019   HCT 50.1 03/12/2019   MCV 101 (H) 03/12/2019   No results found for: IRON, TIBC, FERRITIN  Attestation Statements:   Reviewed by clinician on day of visit: allergies, medications, problem list, medical  history, surgical history, family history, social history, and previous encounter notes.  Time spent on visit including pre-visit chart review and post-visit care and charting was 30 minutes.    I, Trixie Dredge, am acting as transcriptionist for Dennard Nip, MD.  I have reviewed the above documentation for accuracy and completeness, and I agree with the above. -  Delrae Hagey [employer]

## 2020-10-27 ENCOUNTER — Telehealth (INDEPENDENT_AMBULATORY_CARE_PROVIDER_SITE_OTHER): Payer: Self-pay

## 2020-10-27 NOTE — Telephone Encounter (Signed)
PA has been initiated via [email or web address] for Saxenda 18mg /38mL.   Key: [id number] Rx #: [id number] Saxenda 18MG F[employer] Mediate pen-injectors   Form: Med[employer] ePA Form 2017 NCP[place] Determination: Wait for Determination Please wait for MedImpact 2017 to return a determination.

## 2020-10-29 MED FILL — SAXENDA 18 MG/3 ML PEN: 18 | 30 days supply | Qty: 15 | Fill #0

## 2020-10-30 ENCOUNTER — Other Ambulatory Visit (HOSPITAL_COMMUNITY): Payer: Self-pay

## 2020-10-30 MED FILL — LATUDA 40 MG TABLET: 40 | 90 days supply | Qty: 90 | Fill #0

## 2020-11-02 ENCOUNTER — Other Ambulatory Visit (HOSPITAL_COMMUNITY): Payer: Self-pay

## 2020-11-02 MED FILL — DIVALPROEX SOD ER 500 MG TA: 500 | 90 days supply | Qty: 180 | Fill #0

## 2020-11-03 NOTE — Telephone Encounter (Signed)
PA for Brian Cunningham has been approved. Approval is good for 12 refills from 10/29/20 through 10/28/21 as long as pt is enrolled as a member of current health plan.

## 2020-11-10 ENCOUNTER — Other Ambulatory Visit (HOSPITAL_COMMUNITY): Payer: Self-pay

## 2020-11-10 DIAGNOSIS — F902 Attention-deficit hyperactivity disorder, combined type: Secondary | ICD-10-CM | POA: Diagnosis not present

## 2020-11-10 DIAGNOSIS — G4700 Insomnia, unspecified: Secondary | ICD-10-CM | POA: Diagnosis not present

## 2020-11-10 DIAGNOSIS — F3132 Bipolar disorder, current episode depressed, moderate: Secondary | ICD-10-CM | POA: Diagnosis not present

## 2020-11-18 ENCOUNTER — Other Ambulatory Visit (HOSPITAL_COMMUNITY): Payer: Self-pay | Admitting: Physician Assistant

## 2020-11-18 MED FILL — OSCIMIN 0.125 MG SUBL: 0.125 | 30 days supply | Qty: 90 | Fill #0

## 2020-11-23 ENCOUNTER — Other Ambulatory Visit (INDEPENDENT_AMBULATORY_CARE_PROVIDER_SITE_OTHER): Payer: Self-pay | Admitting: Family Medicine

## 2020-11-23 DIAGNOSIS — R7303 Prediabetes: Secondary | ICD-10-CM

## 2020-11-23 MED FILL — SILDENAFIL CITRATE 100 MG T: 100 | 30 days supply | Qty: 6 | Fill #6

## 2020-11-23 NOTE — Telephone Encounter (Signed)
Please advise 

## 2020-11-23 NOTE — Telephone Encounter (Signed)
Last OV with Dr. Beasley 

## 2020-11-24 ENCOUNTER — Other Ambulatory Visit (INDEPENDENT_AMBULATORY_CARE_PROVIDER_SITE_OTHER): Payer: Self-pay | Admitting: Family Medicine

## 2020-11-24 DIAGNOSIS — K580 Irritable bowel syndrome with diarrhea: Secondary | ICD-10-CM | POA: Diagnosis not present

## 2020-11-24 DIAGNOSIS — Z1211 Encounter for screening for malignant neoplasm of colon: Secondary | ICD-10-CM | POA: Diagnosis not present

## 2020-11-24 DIAGNOSIS — Z79899 Other long term (current) drug therapy: Secondary | ICD-10-CM | POA: Diagnosis not present

## 2020-11-24 MED FILL — METFORMIN HCL 500 MG TABS: 500 | 30 days supply | Qty: 60 | Fill #0

## 2020-11-24 MED FILL — DOXYCYCLINE MONO 100 MG CAP: 100 | 14 days supply | Qty: 28 | Fill #0

## 2020-11-24 NOTE — Telephone Encounter (Signed)
Rx sent 

## 2020-11-24 NOTE — Telephone Encounter (Signed)
RF x 1

## 2020-11-25 ENCOUNTER — Encounter (INDEPENDENT_AMBULATORY_CARE_PROVIDER_SITE_OTHER): Payer: Self-pay | Admitting: Family Medicine

## 2020-11-25 ENCOUNTER — Other Ambulatory Visit (INDEPENDENT_AMBULATORY_CARE_PROVIDER_SITE_OTHER): Payer: Self-pay | Admitting: Family Medicine

## 2020-11-25 ENCOUNTER — Ambulatory Visit (INDEPENDENT_AMBULATORY_CARE_PROVIDER_SITE_OTHER): Payer: 59 | Admitting: Family Medicine

## 2020-11-25 ENCOUNTER — Other Ambulatory Visit: Payer: Self-pay

## 2020-11-25 VITALS — BP 114/74 | HR 71 | Temp 98.5°F | Ht 74.0 in | Wt 232.0 lb

## 2020-11-25 DIAGNOSIS — E8881 Metabolic syndrome: Secondary | ICD-10-CM | POA: Diagnosis not present

## 2020-11-25 DIAGNOSIS — Z6830 Body mass index (BMI) 30.0-30.9, adult: Secondary | ICD-10-CM | POA: Diagnosis not present

## 2020-11-25 DIAGNOSIS — F3289 Other specified depressive episodes: Secondary | ICD-10-CM | POA: Diagnosis not present

## 2020-11-25 DIAGNOSIS — Z9189 Other specified personal risk factors, not elsewhere classified: Secondary | ICD-10-CM | POA: Diagnosis not present

## 2020-11-25 DIAGNOSIS — E669 Obesity, unspecified: Secondary | ICD-10-CM | POA: Diagnosis not present

## 2020-11-25 MED ORDER — UNIFINE PENTIPS 32G X 4 MM MISC: 0 refills | Status: DC

## 2020-11-25 MED ORDER — SAXENDA 18 MG/3ML ~~LOC~~ SOPN: PEN_INJECTOR | SUBCUTANEOUS | 0 refills | Status: DC

## 2020-11-25 MED ORDER — METFORMIN HCL 500 MG PO TABS
500.0000 mg | ORAL_TABLET | Freq: Two times a day (BID) | ORAL | 0 refills | Status: DC
Start: 2020-11-25 — End: 2020-11-25

## 2020-12-02 ENCOUNTER — Other Ambulatory Visit (HOSPITAL_COMMUNITY): Payer: Self-pay

## 2020-12-02 MED FILL — Metformin HCl Tab 500 MG: ORAL | 30 days supply | Qty: 60 | Fill #0 | Status: CN

## 2020-12-02 MED FILL — Amphetamine-Dextroamphetamine Cap ER 24HR 20 MG: ORAL | 30 days supply | Qty: 30 | Fill #0 | Status: AC

## 2020-12-02 MED FILL — Liraglutide (Weight Mngmt) Soln Pen-Inj 18 MG/3ML (6 MG/ML): SUBCUTANEOUS | 30 days supply | Qty: 15 | Fill #0 | Status: AC

## 2020-12-02 MED FILL — Insulin Pen Needle 32 G X 4 MM (1/6" or 5/32"): 100 days supply | Qty: 100 | Fill #0 | Status: CN

## 2020-12-02 NOTE — Progress Notes (Signed)
Chief Complaint:   OBESITY Brian Cunningham is here to discuss his progress with his obesity treatment plan along with follow-up of his obesity related diagnoses. Brian Cunningham is on the Category 4 Plan and states he is following his eating plan approximately 50% of the time. [name] states he is walking for 15 minutes 1 time per week.  Today's visit was #: 52 Starting weight: 262 lbs Starting date: 03/12/2019 Today's weight: 232 lbs Today's date: 11/25/2020 Total lbs lost to date: 30 Total lbs lost since last in-office visit: 0  Interim History: Brian Cunningham has been very busy with work and studying, and he has had less time to concentrate on weight loss. Things should calm down for him soon and he will be able to get back on track. He is tolerating Saxenda well and requests a refill.  Subjective:   1. Insulin resistance Brian Cunningham is stable on metformin and GLP-1, and he denies nausea, vomiting, or hypoglycemia. He is working on diet and weight loss, but he had struggled a bit more recently.  2. Other depression with emotional eating Brian Cunningham notes increased cravings in the evenings. He has increased stress with work/school, and his upcoming presentation which may be continuing to cause emotional eating behaviors.   3. At risk for heart disease Brian Cunningham is at a higher than average risk for cardiovascular disease due to obesity.   Assessment/Plan:   1. Insulin resistance Brian Cunningham will continue to work on weight loss, diet, exercise, and decreasing simple carbohydrates to help decrease the risk of diabetes. We will refill metformin 500 mg BID #60 for 1 month. Brian Cunningham agreed to follow-up with Brian Cunningham as directed to closely monitor his progress.  - metFORMIN (GLUCOPHAGE) 500 MG tablet; Take 1 tablet (500 MG) by mouth BID times daily.  Dispense: 60 tablet; Refill: 0  2. Other depression with emotional eating Behavior modification techniques were discussed today to help  Brian Cunningham deal with his emotional/non-hunger eating behaviors. Amel agreed to start Topamax 50 mg qhs #30 with no refills. Orders and follow up as documented in patient record.   3. At risk for heart disease Brian Cunningham was given approximately 15 minutes of coronary artery disease prevention counseling today. He is 50 y.o. male and has risk factors for heart disease including obesity. We discussed intensive lifestyle modifications today with an emphasis on specific weight loss instructions and strategies.   Repetitive spaced learning was employed today to elicit superior memory formation and behavioral change.  4. Class 1 obesity with serious comorbidity and body mass index (BMI) of 30.0 to 30.9 in adult, unspecified obesity type Brian Cunningham is currently in the action stage of change. As such, his goal is to continue with weight loss efforts. He has agreed to the Category 4 Plan.   We discussed various medication options to help Brian Cunningham with his weight loss efforts and we both agreed to continue Saxenda 3 mg and we will refill for 1 month, and we will refill nano needles #100 with no refills.  - Liraglutide -Weight Management (SAXENDA) 18 MG/3ML SOPN; Inject 3 mg into the skin daily.  Dispense: 15 mL; Refill: 0  Exercise goals: As is.  Behavioral modification strategies: increasing lean protein intake, decreasing simple carbohydrates and decreasing liquid calories.  Brian Cunningham has agreed to follow-up with our clinic in 4 weeks. He was informed of the importance of frequent follow-up visits to maximize his success with intensive lifestyle modifications for his multiple health conditions.   Objective:   Blood pressure 114/74, pulse 71,  temperature 98.5 F (36.9 C), height 6\' 2"  (1.88 m), weight 232 lb (105.2 kg), SpO2 98 %. Body mass index is 29.79 kg/m.  General: Cooperative, alert, well developed, in no acute distress. HEENT: Conjunctivae and lids  unremarkable. Cardiovascular: Regular rhythm.  Lungs: Normal work of breathing. Neurologic: No focal deficits.   Lab Results  Component Value Date   CREATININE 0.97 06/10/2020   BUN 16 06/10/2020   NA 138 06/10/2020   K 4.4 06/10/2020   CL 95 (L) 06/10/2020   CO2 29 06/10/2020   Lab Results  Component Value Date   ALT 32 06/10/2020   AST 28 06/10/2020   ALKPHOS 53 06/10/2020   BILITOT 0.7 06/10/2020   Lab Results  Component Value Date   HGBA1C 5.1 06/10/2020   HGBA1C 4.8 12/24/2019   HGBA1C 4.8 08/13/2019   HGBA1C 5.2 03/12/2019   Lab Results  Component Value Date   INSULIN 9.6 06/10/2020   INSULIN 13.0 12/24/2019   INSULIN 18.1 08/13/2019   INSULIN 31.1 (H) 03/12/2019   Lab Results  Component Value Date   TSH 3.510 06/10/2020   Lab Results  Component Value Date   CHOL 129 06/10/2020   HDL 41 06/10/2020   LDLCALC 72 06/10/2020   TRIG 78 06/10/2020   Lab Results  Component Value Date   WBC 5.4 03/12/2019   HGB 16.9 03/12/2019   HCT 50.1 03/12/2019   MCV 101 (H) 03/12/2019   No results found for: IRON, TIBC, FERRITIN  Attestation Statements:   Reviewed by clinician on day of visit: allergies, medications, problem list, medical history, surgical history, family history, social history, and previous encounter notes.   I, Trixie Dredge, am acting as transcriptionist for Dennard Nip, MD.  I have reviewed the above documentation for accuracy and completeness, and I agree with the above. -  Dennard Nip, MD

## 2020-12-06 ENCOUNTER — Other Ambulatory Visit (HOSPITAL_COMMUNITY): Payer: Self-pay

## 2020-12-06 MED FILL — Gabapentin Cap 300 MG: ORAL | 30 days supply | Qty: 90 | Fill #0 | Status: CN

## 2020-12-07 ENCOUNTER — Other Ambulatory Visit (HOSPITAL_COMMUNITY): Payer: Self-pay

## 2020-12-08 ENCOUNTER — Other Ambulatory Visit (HOSPITAL_COMMUNITY): Payer: Self-pay

## 2020-12-08 MED ORDER — TOPIRAMATE 50 MG PO TABS
50.0000 mg | ORAL_TABLET | Freq: Every day | ORAL | 0 refills | Status: DC
  Filled 2020-12-08: qty 30, 30d supply, fill #0

## 2020-12-11 ENCOUNTER — Other Ambulatory Visit (HOSPITAL_COMMUNITY): Payer: Self-pay

## 2020-12-11 MED FILL — Trazodone HCl Tab 100 MG: ORAL | 90 days supply | Qty: 90 | Fill #0 | Status: CN

## 2020-12-12 ENCOUNTER — Other Ambulatory Visit (HOSPITAL_COMMUNITY): Payer: Self-pay

## 2020-12-12 MED FILL — Gabapentin Cap 300 MG: ORAL | 30 days supply | Qty: 90 | Fill #0 | Status: AC

## 2020-12-14 ENCOUNTER — Other Ambulatory Visit (HOSPITAL_COMMUNITY): Payer: Self-pay

## 2020-12-14 MED FILL — Rifaximin Tab 550 MG: ORAL | 14 days supply | Qty: 42 | Fill #0 | Status: CN

## 2020-12-14 MED FILL — Eluxadoline Tab 75 MG: ORAL | 30 days supply | Qty: 60 | Fill #0 | Status: AC

## 2020-12-14 MED FILL — Eluxadoline Tab 75 MG: ORAL | 30 days supply | Qty: 60 | Fill #0 | Status: CN

## 2020-12-15 ENCOUNTER — Other Ambulatory Visit (HOSPITAL_COMMUNITY): Payer: Self-pay

## 2020-12-15 MED FILL — Venlafaxine HCl Cap ER 24HR 75 MG (Base Equivalent): ORAL | 90 days supply | Qty: 90 | Fill #0 | Status: AC

## 2020-12-19 ENCOUNTER — Other Ambulatory Visit (HOSPITAL_COMMUNITY): Payer: Self-pay

## 2020-12-19 MED FILL — Sildenafil Citrate Tab 100 MG: ORAL | 30 days supply | Qty: 6 | Fill #0 | Status: AC

## 2020-12-21 ENCOUNTER — Ambulatory Visit (INDEPENDENT_AMBULATORY_CARE_PROVIDER_SITE_OTHER): Payer: 59 | Admitting: Family Medicine

## 2020-12-21 ENCOUNTER — Other Ambulatory Visit (HOSPITAL_COMMUNITY): Payer: Self-pay

## 2020-12-21 MED FILL — Mirtazapine Tab 15 MG: ORAL | 90 days supply | Qty: 90 | Fill #0 | Status: CN

## 2020-12-23 ENCOUNTER — Encounter (INDEPENDENT_AMBULATORY_CARE_PROVIDER_SITE_OTHER): Payer: Self-pay | Admitting: Family Medicine

## 2020-12-23 ENCOUNTER — Other Ambulatory Visit (HOSPITAL_COMMUNITY): Payer: Self-pay

## 2020-12-29 ENCOUNTER — Other Ambulatory Visit (HOSPITAL_COMMUNITY): Payer: Self-pay

## 2020-12-29 ENCOUNTER — Ambulatory Visit (INDEPENDENT_AMBULATORY_CARE_PROVIDER_SITE_OTHER): Payer: 59 | Admitting: Family Medicine

## 2020-12-29 ENCOUNTER — Encounter (INDEPENDENT_AMBULATORY_CARE_PROVIDER_SITE_OTHER): Payer: Self-pay | Admitting: Family Medicine

## 2020-12-29 ENCOUNTER — Other Ambulatory Visit: Payer: Self-pay

## 2020-12-29 VITALS — BP 117/72 | HR 73 | Temp 97.2°F | Ht 74.0 in | Wt 230.0 lb

## 2020-12-29 DIAGNOSIS — Z6833 Body mass index (BMI) 33.0-33.9, adult: Secondary | ICD-10-CM | POA: Diagnosis not present

## 2020-12-29 DIAGNOSIS — E8881 Metabolic syndrome: Secondary | ICD-10-CM

## 2020-12-29 DIAGNOSIS — E669 Obesity, unspecified: Secondary | ICD-10-CM | POA: Diagnosis not present

## 2020-12-29 DIAGNOSIS — Z9189 Other specified personal risk factors, not elsewhere classified: Secondary | ICD-10-CM

## 2020-12-29 DIAGNOSIS — E7849 Other hyperlipidemia: Secondary | ICD-10-CM | POA: Diagnosis not present

## 2020-12-29 MED ORDER — METFORMIN HCL 500 MG PO TABS
500.0000 mg | ORAL_TABLET | Freq: Two times a day (BID) | ORAL | 0 refills | Status: DC
  Filled 2020-12-29: qty 60, 30d supply, fill #0

## 2020-12-29 MED ORDER — WEGOVY 2.4 MG/0.75ML ~~LOC~~ SOAJ
2.4000 mg | SUBCUTANEOUS | 0 refills | Status: DC
  Filled 2020-12-29 – 2021-01-19 (×3): qty 3, 28d supply, fill #0

## 2020-12-29 MED ORDER — INSULIN PEN NEEDLE 32G X 4 MM MISC
0 refills | Status: DC
  Filled 2020-12-29: qty 100, 90d supply, fill #0

## 2020-12-31 ENCOUNTER — Other Ambulatory Visit (HOSPITAL_COMMUNITY): Payer: Self-pay

## 2021-01-04 ENCOUNTER — Other Ambulatory Visit (HOSPITAL_COMMUNITY): Payer: Self-pay

## 2021-01-04 NOTE — Progress Notes (Signed)
Chief Complaint:   OBESITY Brian Cunningham is here to discuss his progress with his obesity treatment plan along with follow-up of his obesity related diagnoses. [name] is on the Category 4 Plan and states he is following his eating plan approximately 40-50% of the time. [name] states he is doing weight training for 20 minutes 2 times per week, and walking 6,000 step 5 times per week.  Today's visit was #: 20 Starting weight: 262 lbs Starting date: 03/12/2019 Today's weight: 230 lbs Today's date: 12/29/2020 Total lbs lost to date: 32 Total lbs lost since last in-office visit: 2  Interim History: Brian Cunningham has continues to do well with weight loss. He is tolerating Saxenda but he wonders if he would do better with [facility]. He also likes the thought of a 1 times per week injection.  Subjective:   1. Insulin resistance [name] is stable on metformin, and he is doing well on his diet and with weight loss.  2. Other hyperlipidemia [name] is stable on Lipitor, and he is doing well on his diet prescription and with weight loss. No chest pain or myalgias was mentioned.  3. At risk for nausea [name] is at risk for nausea due to [facility].  Assessment/Plan:   1. Insulin resistance Brian Cunningham will continue to work on diet, exercise, and decreasing simple carbohydrates to help decrease the risk of diabetes. We will refill metformin for 1 month. Brian Cunningham agreed to follow-up with [name] as directed to closely monitor his progress.  - metFORMIN (GLUCOPHAGE) 500 MG tablet; Take 1 tablet (500 mg total) by mouth 2 (two) times daily with a meal.  Dispense: 60 tablet; Refill: 0  2. Other hyperlipidemia Cardiovascular risk and specific lipid/LDL goals reviewed. We discussed several lifestyle modifications today. Rayfield will continue Lipitor, diet, exercise and weight loss efforts. Orders and follow up as documented in patient record.   3. At risk for nausea Brian Cunningham was given approximately 15 minutes of nausea prevention counseling today. Brian Cunningham is at risk for nausea due to his new or current medication. He was encouraged to titrate his medication slowly, make sure to stay hydrated, eat smaller portions throughout the day, and avoid high fat meals.   4. Obesity, current BMI 29.5 Brian Cunningham is currently in the action stage of change. As such, his goal is to continue with weight loss efforts. He has agreed to the Category 4 Plan.   We discussed various medication options to help [name] with his weight loss efforts and we both agreed to discontinue Saxenda, and he agreed to start [facility] 2.4 mg q weekly with no refills, and pen needles #100 with no refills. (He is to use Saxenda until he changes to [facility]).  - Semaglutide-Weight Management (WEGOVY) 2.4 MG/0.75ML SOAJ; Inject 2.4 mg into the skin once a week.  Dispense: 3 mL; Refill: 0 - Insulin Pen Needle 32G X 4 MM MISC; Use as directed with Saxenda daily.  Dispense: 100 each; Refill: 0  Exercise goals: As is.  Behavioral modification strategies: increasing lean protein intake and meal planning and cooking strategies.  Brian Cunningham has agreed to follow-up with our clinic in 4 weeks. He was informed of the importance of frequent follow-up visits to maximize his success with intensive lifestyle modifications for his multiple health conditions.   Objective:   Blood pressure 117/72, pulse 73, temperature (!) 97.2 F (36.2 C), height 6\' 2"  (1.88 m), weight 230 lb (104.3 kg), SpO2 98 %. Body mass index is 29.53 kg/m.  General: Cooperative,  alert, well developed, in no acute distress. HEENT: Conjunctivae and lids unremarkable. Cardiovascular: Regular rhythm.  Lungs: Normal work of breathing. Neurologic: No focal deficits.   Lab Results  Component Value Date   CREATININE 0.97 06/10/2020   BUN 16 06/10/2020   NA 138 06/10/2020   K 4.4 06/10/2020   CL 95 (L) 06/10/2020   CO2 29 06/10/2020    Lab Results  Component Value Date   ALT 32 06/10/2020   AST 28 06/10/2020   ALKPHOS 53 06/10/2020   BILITOT 0.7 06/10/2020   Lab Results  Component Value Date   HGBA1C 5.1 06/10/2020   HGBA1C 4.8 12/24/2019   HGBA1C 4.8 08/13/2019   HGBA1C 5.2 03/12/2019   Lab Results  Component Value Date   INSULIN 9.6 06/10/2020   INSULIN 13.0 12/24/2019   INSULIN 18.1 08/13/2019   INSULIN 31.1 (H) 03/12/2019   Lab Results  Component Value Date   TSH 3.510 06/10/2020   Lab Results  Component Value Date   CHOL 129 06/10/2020   HDL 41 06/10/2020   LDLCALC 72 06/10/2020   TRIG 78 06/10/2020   Lab Results  Component Value Date   WBC 5.4 03/12/2019   HGB 16.9 03/12/2019   HCT 50.1 03/12/2019   MCV 101 (H) 03/12/2019   No results found for: IRON, TIBC, FERRITIN  Attestation Statements:   Reviewed by clinician on day of visit: allergies, medications, problem list, medical history, surgical history, family history, social history, and previous encounter notes.   I, Trixie Dredge, am acting as Location manager for Dennard Nip, MD.  I have reviewed the above documentation for accuracy and completeness, and I agree with the above. -  Dennard Nip, MD

## 2021-01-06 ENCOUNTER — Other Ambulatory Visit (HOSPITAL_COMMUNITY): Payer: Self-pay

## 2021-01-06 MED ORDER — ADDERALL XR 20 MG PO CP24
20.0000 mg | ORAL_CAPSULE | Freq: Every morning | ORAL | 0 refills | Status: DC
  Filled 2021-01-06: qty 30, 30d supply, fill #0

## 2021-01-11 ENCOUNTER — Other Ambulatory Visit (HOSPITAL_COMMUNITY): Payer: Self-pay

## 2021-01-11 NOTE — Telephone Encounter (Signed)
Pt last seen by Dr. Beasley.  

## 2021-01-12 ENCOUNTER — Other Ambulatory Visit (HOSPITAL_COMMUNITY): Payer: Self-pay

## 2021-01-12 MED ORDER — GABAPENTIN 300 MG PO CAPS
1.0000 | ORAL_CAPSULE | Freq: Three times a day (TID) | ORAL | 3 refills | Status: DC
  Filled 2021-01-12: qty 90, 30d supply, fill #0

## 2021-01-13 ENCOUNTER — Telehealth (INDEPENDENT_AMBULATORY_CARE_PROVIDER_SITE_OTHER): Payer: Self-pay | Admitting: Emergency Medicine

## 2021-01-13 ENCOUNTER — Other Ambulatory Visit (HOSPITAL_COMMUNITY): Payer: Self-pay

## 2021-01-13 NOTE — Telephone Encounter (Signed)
Wegovy was denied by insurance.

## 2021-01-13 NOTE — Telephone Encounter (Signed)
Prior Auth Brian Cunningham Brian Cunningham  REF # [id number] On 01/13/21

## 2021-01-14 ENCOUNTER — Other Ambulatory Visit (HOSPITAL_COMMUNITY): Payer: Self-pay

## 2021-01-19 ENCOUNTER — Other Ambulatory Visit (HOSPITAL_COMMUNITY): Payer: Self-pay

## 2021-01-19 DIAGNOSIS — M7918 Myalgia, other site: Secondary | ICD-10-CM | POA: Diagnosis not present

## 2021-01-19 DIAGNOSIS — G501 Atypical facial pain: Secondary | ICD-10-CM | POA: Diagnosis not present

## 2021-01-19 DIAGNOSIS — M26622 Arthralgia of left temporomandibular joint: Secondary | ICD-10-CM | POA: Diagnosis not present

## 2021-01-19 MED ORDER — VIBERZI 75 MG PO TABS
75.0000 mg | ORAL_TABLET | Freq: Every day | ORAL | 3 refills | Status: DC
  Filled 2021-01-19: qty 90, 90d supply, fill #0

## 2021-01-19 NOTE — Telephone Encounter (Signed)
Yes, 3.0 mg qday # 5 pens no refills

## 2021-01-20 ENCOUNTER — Other Ambulatory Visit (HOSPITAL_COMMUNITY): Payer: Self-pay

## 2021-01-20 ENCOUNTER — Other Ambulatory Visit (INDEPENDENT_AMBULATORY_CARE_PROVIDER_SITE_OTHER): Payer: Self-pay | Admitting: Emergency Medicine

## 2021-01-20 DIAGNOSIS — E669 Obesity, unspecified: Secondary | ICD-10-CM

## 2021-01-20 MED ORDER — SAXENDA 18 MG/3ML ~~LOC~~ SOPN
3.0000 mg | PEN_INJECTOR | Freq: Every day | SUBCUTANEOUS | 0 refills | Status: DC
  Filled 2021-01-20: qty 15, 30d supply, fill #0

## 2021-01-20 MED FILL — Levothyroxine Sodium Tab 88 MCG: ORAL | 90 days supply | Qty: 90 | Fill #0 | Status: AC

## 2021-01-20 MED FILL — Eluxadoline Tab 75 MG: ORAL | 30 days supply | Qty: 60 | Fill #1 | Status: CN

## 2021-01-20 MED FILL — Atorvastatin Calcium Tab 40 MG (Base Equivalent): ORAL | 90 days supply | Qty: 90 | Fill #0 | Status: AC

## 2021-01-20 MED FILL — Sildenafil Citrate Tab 100 MG: ORAL | 30 days supply | Qty: 6 | Fill #1 | Status: AC

## 2021-01-21 ENCOUNTER — Other Ambulatory Visit (INDEPENDENT_AMBULATORY_CARE_PROVIDER_SITE_OTHER): Payer: Self-pay | Admitting: Family Medicine

## 2021-01-21 DIAGNOSIS — F3289 Other specified depressive episodes: Secondary | ICD-10-CM

## 2021-01-21 NOTE — Telephone Encounter (Signed)
Pt last seen by Dr. Beasley.  

## 2021-01-21 NOTE — Telephone Encounter (Signed)
Patient is requesting a refill of the following medications: Requested Prescriptions   Pending Prescriptions Disp Refills   topiramate (TOPAMAX) 50 MG tablet 30 tablet 0    Sig: Take 1 tablet (50 mg total) by mouth daily.    Last office visit: 12/29/20 Date of last refill: 12/08/20 Last refill amount: 30 Follow up time period per chart: 4 week Next appt scheduled: 01/28/21

## 2021-01-22 ENCOUNTER — Other Ambulatory Visit (HOSPITAL_COMMUNITY): Payer: Self-pay

## 2021-01-22 ENCOUNTER — Other Ambulatory Visit (INDEPENDENT_AMBULATORY_CARE_PROVIDER_SITE_OTHER): Payer: Self-pay | Admitting: Family Medicine

## 2021-01-22 DIAGNOSIS — F3289 Other specified depressive episodes: Secondary | ICD-10-CM

## 2021-01-25 ENCOUNTER — Other Ambulatory Visit (HOSPITAL_COMMUNITY): Payer: Self-pay

## 2021-01-25 MED FILL — Eluxadoline Tab 75 MG: ORAL | 30 days supply | Qty: 60 | Fill #1 | Status: CN

## 2021-01-25 MED FILL — Omeprazole Cap Delayed Release 40 MG: ORAL | 90 days supply | Qty: 90 | Fill #0 | Status: AC

## 2021-01-26 ENCOUNTER — Other Ambulatory Visit (HOSPITAL_COMMUNITY): Payer: Self-pay

## 2021-01-26 MED ORDER — CARESTART COVID-19 HOME TEST VI KIT
PACK | 0 refills | Status: DC
  Filled 2021-01-26: qty 4, 4d supply, fill #0

## 2021-01-26 MED ORDER — TOPIRAMATE 50 MG PO TABS
50.0000 mg | ORAL_TABLET | Freq: Every day | ORAL | 0 refills | Status: DC
  Filled 2021-01-26: qty 30, 30d supply, fill #0

## 2021-01-26 NOTE — Telephone Encounter (Signed)
Ok x 1

## 2021-01-26 NOTE — Telephone Encounter (Signed)
Patient is requesting a refill of the following medications: Requested Prescriptions   Pending Prescriptions Disp Refills   topiramate (TOPAMAX) 50 MG tablet 30 tablet 0    Sig: Take 1 tablet (50 mg total) by mouth daily.    Last office visit: 12/29/20 Date of last refill: 12/08/20 Last refill amount:30 Follow up time period per chart: 4 week Next scheduled appt: 01/28/21

## 2021-01-27 ENCOUNTER — Other Ambulatory Visit (HOSPITAL_COMMUNITY): Payer: Self-pay

## 2021-01-28 ENCOUNTER — Other Ambulatory Visit (HOSPITAL_COMMUNITY): Payer: Self-pay

## 2021-01-28 ENCOUNTER — Encounter (INDEPENDENT_AMBULATORY_CARE_PROVIDER_SITE_OTHER): Payer: Self-pay | Admitting: Family Medicine

## 2021-01-28 ENCOUNTER — Other Ambulatory Visit: Payer: Self-pay

## 2021-01-28 ENCOUNTER — Ambulatory Visit (INDEPENDENT_AMBULATORY_CARE_PROVIDER_SITE_OTHER): Payer: 59 | Admitting: Family Medicine

## 2021-01-28 VITALS — BP 99/64 | HR 69 | Temp 98.3°F | Ht 74.0 in | Wt 231.0 lb

## 2021-01-28 DIAGNOSIS — Z9189 Other specified personal risk factors, not elsewhere classified: Secondary | ICD-10-CM

## 2021-01-28 DIAGNOSIS — Z6833 Body mass index (BMI) 33.0-33.9, adult: Secondary | ICD-10-CM | POA: Diagnosis not present

## 2021-01-28 DIAGNOSIS — E669 Obesity, unspecified: Secondary | ICD-10-CM

## 2021-01-28 DIAGNOSIS — R7303 Prediabetes: Secondary | ICD-10-CM | POA: Diagnosis not present

## 2021-01-28 MED ORDER — INSULIN PEN NEEDLE 32G X 4 MM MISC
0 refills | Status: DC
  Filled 2021-01-28: qty 100, fill #0

## 2021-01-28 MED ORDER — METFORMIN HCL 500 MG PO TABS
500.0000 mg | ORAL_TABLET | Freq: Two times a day (BID) | ORAL | 0 refills | Status: DC
  Filled 2021-01-28: qty 60, 30d supply, fill #0

## 2021-01-28 MED ORDER — SAXENDA 18 MG/3ML ~~LOC~~ SOPN
3.0000 mg | PEN_INJECTOR | Freq: Every day | SUBCUTANEOUS | 0 refills | Status: DC
  Filled 2021-01-28 – 2021-02-22 (×2): qty 15, 30d supply, fill #0

## 2021-01-28 MED FILL — Eluxadoline Tab 75 MG: ORAL | 30 days supply | Qty: 60 | Fill #1 | Status: CN

## 2021-01-29 ENCOUNTER — Other Ambulatory Visit (HOSPITAL_COMMUNITY): Payer: Self-pay

## 2021-02-02 ENCOUNTER — Other Ambulatory Visit (HOSPITAL_COMMUNITY): Payer: Self-pay

## 2021-02-02 NOTE — Progress Notes (Signed)
Chief Complaint:   OBESITY Brian Cunningham is here to discuss his progress with his obesity treatment plan along with follow-up of his obesity related diagnoses. Brian Cunningham is on the Category 4 Plan and states he is following his eating plan approximately 50% of the time. Brian Cunningham states he is walking steps, and doing weight lifting for 4-5 times per week.  Today's visit was #: 21 Starting weight: 262 lbs Starting date: 03/12/2019 Today's weight: 231 lbs Today's date: 01/28/2021 Total lbs lost to date: 31 Total lbs lost since last in-office visit: 0  Interim History: Brian Cunningham has been struggling more with meal planning and eating his protein goals. He will be traveling soon and he is open to discussing some travel eating strategies.  Subjective:   1. Pre-diabetes Brian Cunningham is stable on metformin and GLP-1. He is working on diet, exercise, and weight loss. He denies nausea or vomiting.  2. At risk for impaired metabolic function Brian Cunningham is at increased risk for impaired metabolic function if protein decreases.  Assessment/Plan:   1. Pre-diabetes Brian Cunningham will continue to work on weight loss, exercise, and decreasing simple carbohydrates to help decrease the risk of diabetes. We will refill metformin for 1 month.  - metFORMIN (GLUCOPHAGE) 500 MG tablet; Take 1 tablet (500 mg total) by mouth 2 (two) times daily with a meal.  Dispense: 60 tablet; Refill: 0  2. At risk for impaired metabolic function Brian Cunningham was given approximately 15 minutes of impaired  metabolic function prevention counseling today. We discussed intensive lifestyle modifications today with an emphasis on specific nutrition and exercise instructions and strategies.   Repetitive spaced learning was employed today to elicit superior memory formation and behavioral change.  3. Obesity, current BMI 29.7 Brian Cunningham is currently in the action stage of change. As such, his goal is to continue with  weight loss efforts. He has agreed to the Category 4 Plan.   We will recheck fasting labs at his next visit.  We discussed various medication options to help Brian Cunningham with his weight loss efforts and we both agreed to continue Saxenda, and we will refill for 1 month, and we will refill pen needles #100 with no refills.  - Insulin Pen Needle 32G X 4 MM MISC; Use as directed with Saxenda daily. (Patient taking differently: Use as directed with Saxenda daily.)  Dispense: 100 each; Refill: 0 - Liraglutide -Weight Management (SAXENDA) 18 MG/3ML SOPN; Inject 3 mg into the skin daily.  Dispense: 15 mL; Refill: 0  Exercise goals: As is.  Behavioral modification strategies: increasing lean protein intake, travel eating strategies and planning for success.  Brian Cunningham has agreed to follow-up with our clinic in 4 weeks. He was informed of the importance of frequent follow-up visits to maximize his success with intensive lifestyle modifications for his multiple health conditions.   Objective:   Blood pressure 99/64, pulse 69, temperature 98.3 F (36.8 C), height 6\' 2"  (1.88 m), weight 231 lb (104.8 kg), SpO2 97 %. Body mass index is 29.66 kg/m.  General: Cooperative, alert, well developed, in no acute distress. HEENT: Conjunctivae and lids unremarkable. Cardiovascular: Regular rhythm.  Lungs: Normal work of breathing. Neurologic: No focal deficits.   Lab Results  Component Value Date   CREATININE 0.97 06/10/2020   BUN 16 06/10/2020   NA 138 06/10/2020   K 4.4 06/10/2020   CL 95 (L) 06/10/2020   CO2 29 06/10/2020   Lab Results  Component Value Date   ALT 32 06/10/2020   AST 28  06/10/2020   ALKPHOS 53 06/10/2020   BILITOT 0.7 06/10/2020   Lab Results  Component Value Date   HGBA1C 5.1 06/10/2020   HGBA1C 4.8 12/24/2019   HGBA1C 4.8 08/13/2019   HGBA1C 5.2 03/12/2019   Lab Results  Component Value Date   INSULIN 9.6 06/10/2020   INSULIN 13.0 12/24/2019   INSULIN 18.1  08/13/2019   INSULIN 31.1 (H) 03/12/2019   Lab Results  Component Value Date   TSH 3.510 06/10/2020   Lab Results  Component Value Date   CHOL 129 06/10/2020   HDL 41 06/10/2020   LDLCALC 72 06/10/2020   TRIG 78 06/10/2020   Lab Results  Component Value Date   WBC 5.4 03/12/2019   HGB 16.9 03/12/2019   HCT 50.1 03/12/2019   MCV 101 (H) 03/12/2019   No results found for: IRON, TIBC, FERRITIN  Attestation Statements:   Reviewed by clinician on day of visit: allergies, medications, problem list, medical history, surgical history, family history, social history, and previous encounter notes.   I, Trixie Dredge, am acting as transcriptionist for Dennard Nip, MD.  I have reviewed the above documentation for accuracy and completeness, and I agree with the above. -  Dennard Nip, MD

## 2021-02-03 ENCOUNTER — Other Ambulatory Visit (HOSPITAL_COMMUNITY): Payer: Self-pay

## 2021-02-03 MED ORDER — DIVALPROEX SODIUM ER 500 MG PO TB24
1000.0000 mg | ORAL_TABLET | Freq: Every day | ORAL | 0 refills | Status: DC
  Filled 2021-02-03: qty 180, 90d supply, fill #0

## 2021-02-04 ENCOUNTER — Other Ambulatory Visit (HOSPITAL_COMMUNITY): Payer: Self-pay

## 2021-02-04 MED ORDER — VIBERZI 75 MG PO TABS
ORAL_TABLET | Freq: Every day | ORAL | 3 refills | Status: DC
  Filled 2021-02-04: qty 90, 90d supply, fill #0

## 2021-02-05 ENCOUNTER — Other Ambulatory Visit (INDEPENDENT_AMBULATORY_CARE_PROVIDER_SITE_OTHER): Payer: Self-pay | Admitting: Family Medicine

## 2021-02-05 ENCOUNTER — Other Ambulatory Visit (HOSPITAL_COMMUNITY): Payer: Self-pay

## 2021-02-05 DIAGNOSIS — R7303 Prediabetes: Secondary | ICD-10-CM

## 2021-02-08 ENCOUNTER — Other Ambulatory Visit (HOSPITAL_COMMUNITY): Payer: Self-pay

## 2021-02-08 NOTE — Telephone Encounter (Signed)
Dr.Beasley 

## 2021-02-09 ENCOUNTER — Other Ambulatory Visit (HOSPITAL_COMMUNITY): Payer: Self-pay

## 2021-02-10 ENCOUNTER — Other Ambulatory Visit (HOSPITAL_COMMUNITY): Payer: Self-pay

## 2021-02-12 ENCOUNTER — Other Ambulatory Visit (HOSPITAL_COMMUNITY): Payer: Self-pay

## 2021-02-12 MED ORDER — GABAPENTIN 300 MG PO CAPS
1.0000 | ORAL_CAPSULE | Freq: Three times a day (TID) | ORAL | 3 refills | Status: DC
  Filled 2021-02-12: qty 90, 30d supply, fill #0
  Filled 2021-03-06 – 2021-03-08 (×2): qty 90, 30d supply, fill #1
  Filled 2021-04-13: qty 90, 30d supply, fill #2
  Filled 2021-05-06: qty 4, 2d supply, fill #3
  Filled 2021-05-09: qty 4, 2d supply, fill #4
  Filled 2021-05-10 (×2): qty 86, 29d supply, fill #4

## 2021-02-22 ENCOUNTER — Other Ambulatory Visit (HOSPITAL_COMMUNITY): Payer: Self-pay

## 2021-02-22 MED ORDER — LATUDA 40 MG PO TABS
40.0000 mg | ORAL_TABLET | Freq: Every day | ORAL | 0 refills | Status: AC
  Filled 2021-02-22: qty 90, 90d supply, fill #0

## 2021-02-22 MED ORDER — TRAZODONE HCL 100 MG PO TABS
50.0000 mg | ORAL_TABLET | Freq: Every evening | ORAL | 0 refills | Status: DC
  Filled 2021-02-22: qty 90, 90d supply, fill #0

## 2021-02-22 MED ORDER — AMPHETAMINE-DEXTROAMPHET ER 20 MG PO CP24
20.0000 mg | ORAL_CAPSULE | Freq: Every morning | ORAL | 0 refills | Status: DC
  Filled 2021-02-22: qty 30, 30d supply, fill #0

## 2021-02-23 DIAGNOSIS — F3132 Bipolar disorder, current episode depressed, moderate: Secondary | ICD-10-CM | POA: Diagnosis not present

## 2021-02-23 DIAGNOSIS — F902 Attention-deficit hyperactivity disorder, combined type: Secondary | ICD-10-CM | POA: Diagnosis not present

## 2021-02-23 DIAGNOSIS — G4700 Insomnia, unspecified: Secondary | ICD-10-CM | POA: Diagnosis not present

## 2021-03-02 ENCOUNTER — Other Ambulatory Visit (HOSPITAL_COMMUNITY): Payer: Self-pay

## 2021-03-02 MED ORDER — VIBERZI 100 MG PO TABS
100.0000 mg | ORAL_TABLET | Freq: Two times a day (BID) | ORAL | 0 refills | Status: DC
  Filled 2021-03-02 – 2021-07-06 (×6): qty 180, 90d supply, fill #0

## 2021-03-03 ENCOUNTER — Other Ambulatory Visit (HOSPITAL_COMMUNITY): Payer: Self-pay

## 2021-03-06 ENCOUNTER — Other Ambulatory Visit (HOSPITAL_COMMUNITY): Payer: Self-pay

## 2021-03-08 ENCOUNTER — Ambulatory Visit (INDEPENDENT_AMBULATORY_CARE_PROVIDER_SITE_OTHER): Payer: 59 | Admitting: Family Medicine

## 2021-03-08 ENCOUNTER — Other Ambulatory Visit (HOSPITAL_COMMUNITY): Payer: Self-pay

## 2021-03-08 MED FILL — Eluxadoline Tab 75 MG: ORAL | 30 days supply | Qty: 60 | Fill #1 | Status: CN

## 2021-03-09 ENCOUNTER — Other Ambulatory Visit (HOSPITAL_COMMUNITY): Payer: Self-pay

## 2021-03-09 MED ORDER — MIRTAZAPINE 15 MG PO TABS
7.5000 mg | ORAL_TABLET | Freq: Every day | ORAL | 0 refills | Status: AC
  Filled 2021-03-09: qty 90, 90d supply, fill #0

## 2021-03-09 MED ORDER — VENLAFAXINE HCL ER 75 MG PO CP24
75.0000 mg | ORAL_CAPSULE | Freq: Every morning | ORAL | 0 refills | Status: DC
  Filled 2021-03-09: qty 90, 90d supply, fill #0

## 2021-03-09 MED ORDER — SILDENAFIL CITRATE 100 MG PO TABS
100.0000 mg | ORAL_TABLET | Freq: Every day | ORAL | 6 refills | Status: DC | PRN
  Filled 2021-03-09: qty 6, 30d supply, fill #0
  Filled 2021-04-04: qty 6, 30d supply, fill #1
  Filled 2021-05-09: qty 6, 30d supply, fill #2
  Filled 2021-06-06: qty 6, 30d supply, fill #3
  Filled 2021-07-21: qty 6, 30d supply, fill #4
  Filled 2021-08-15: qty 6, 30d supply, fill #5
  Filled 2021-09-25: qty 6, 30d supply, fill #6

## 2021-03-11 ENCOUNTER — Ambulatory Visit (INDEPENDENT_AMBULATORY_CARE_PROVIDER_SITE_OTHER): Payer: 59 | Admitting: Family Medicine

## 2021-03-11 ENCOUNTER — Other Ambulatory Visit (HOSPITAL_COMMUNITY): Payer: Self-pay

## 2021-03-11 ENCOUNTER — Other Ambulatory Visit: Payer: Self-pay

## 2021-03-11 ENCOUNTER — Encounter (INDEPENDENT_AMBULATORY_CARE_PROVIDER_SITE_OTHER): Payer: Self-pay | Admitting: Family Medicine

## 2021-03-11 VITALS — BP 134/80 | HR 83 | Temp 98.3°F | Ht 74.0 in | Wt 229.0 lb

## 2021-03-11 DIAGNOSIS — E038 Other specified hypothyroidism: Secondary | ICD-10-CM

## 2021-03-11 DIAGNOSIS — E669 Obesity, unspecified: Secondary | ICD-10-CM | POA: Diagnosis not present

## 2021-03-11 DIAGNOSIS — R7303 Prediabetes: Secondary | ICD-10-CM | POA: Diagnosis not present

## 2021-03-11 DIAGNOSIS — E7849 Other hyperlipidemia: Secondary | ICD-10-CM

## 2021-03-11 DIAGNOSIS — Z6833 Body mass index (BMI) 33.0-33.9, adult: Secondary | ICD-10-CM | POA: Diagnosis not present

## 2021-03-11 DIAGNOSIS — Z9189 Other specified personal risk factors, not elsewhere classified: Secondary | ICD-10-CM

## 2021-03-11 DIAGNOSIS — E559 Vitamin D deficiency, unspecified: Secondary | ICD-10-CM | POA: Diagnosis not present

## 2021-03-11 DIAGNOSIS — F3289 Other specified depressive episodes: Secondary | ICD-10-CM

## 2021-03-11 MED ORDER — INSULIN PEN NEEDLE 32G X 4 MM MISC
0 refills | Status: DC
  Filled 2021-03-11: qty 100, fill #0
  Filled 2021-04-15: qty 100, 90d supply, fill #0

## 2021-03-11 MED ORDER — SAXENDA 18 MG/3ML ~~LOC~~ SOPN
3.0000 mg | PEN_INJECTOR | Freq: Every day | SUBCUTANEOUS | 0 refills | Status: DC
  Filled 2021-03-11 – 2021-04-06 (×2): qty 15, 30d supply, fill #0

## 2021-03-11 MED ORDER — METFORMIN HCL 500 MG PO TABS
500.0000 mg | ORAL_TABLET | Freq: Two times a day (BID) | ORAL | 0 refills | Status: DC
  Filled 2021-03-11: qty 60, 30d supply, fill #0

## 2021-03-11 MED ORDER — TOPIRAMATE 50 MG PO TABS
50.0000 mg | ORAL_TABLET | Freq: Every day | ORAL | 0 refills | Status: DC
  Filled 2021-03-11: qty 30, 30d supply, fill #0

## 2021-03-12 ENCOUNTER — Other Ambulatory Visit (HOSPITAL_COMMUNITY): Payer: Self-pay

## 2021-03-12 LAB — CMP14+EGFR
ALT: 34 IU/L (ref 0–44)
AST: 25 IU/L (ref 0–40)
Albumin/Globulin Ratio: 2.6 — ABNORMAL HIGH (ref 1.2–2.2)
Albumin: 4.7 g/dL (ref 4.0–5.0)
Alkaline Phosphatase: 66 IU/L (ref 44–121)
BUN/Creatinine Ratio: 20 (ref 9–20)
BUN: 20 mg/dL (ref 6–24)
Bilirubin Total: 0.7 mg/dL (ref 0.0–1.2)
CO2: 25 mmol/L (ref 20–29)
Calcium: 9.8 mg/dL (ref 8.7–10.2)
Chloride: 101 mmol/L (ref 96–106)
Creatinine, Ser: 0.98 mg/dL (ref 0.76–1.27)
Globulin, Total: 1.8 g/dL (ref 1.5–4.5)
Glucose: 101 mg/dL — ABNORMAL HIGH (ref 65–99)
Potassium: 4.3 mmol/L (ref 3.5–5.2)
Sodium: 141 mmol/L (ref 134–144)
Total Protein: 6.5 g/dL (ref 6.0–8.5)
eGFR: 95 mL/min/{1.73_m2} (ref >59)

## 2021-03-12 LAB — T3: T3, Total: 94 ng/dL (ref 71–180)

## 2021-03-12 LAB — VITAMIN D 25 HYDROXY (VIT D DEFICIENCY, FRACTURES): Vit D, 25-Hydroxy: 46.4 ng/mL (ref 30.0–100.0)

## 2021-03-12 LAB — LIPID PANEL WITH LDL/HDL RATIO
Cholesterol, Total: 191 mg/dL (ref 100–199)
HDL: 50 mg/dL (ref >39)
LDL Chol Calc (NIH): 122 mg/dL — ABNORMAL HIGH (ref 0–99)
LDL/HDL Ratio: 2.4 ratio (ref 0.0–3.6)
Triglycerides: 106 mg/dL (ref 0–149)
VLDL Cholesterol Cal: 19 mg/dL (ref 5–40)

## 2021-03-12 LAB — T4, FREE: Free T4: 1.32 ng/dL (ref 0.82–1.77)

## 2021-03-12 LAB — TSH: TSH: 1.89 u[IU]/mL (ref 0.450–4.500)

## 2021-03-12 LAB — INSULIN, RANDOM: INSULIN: 13.8 u[IU]/mL (ref 2.6–24.9)

## 2021-03-12 LAB — HEMOGLOBIN A1C
Est. average glucose Bld gHb Est-mCnc: 100 mg/dL
Hgb A1c MFr Bld: 5.1 % (ref 4.8–5.6)

## 2021-03-16 ENCOUNTER — Other Ambulatory Visit (HOSPITAL_COMMUNITY): Payer: Self-pay

## 2021-03-19 ENCOUNTER — Other Ambulatory Visit (HOSPITAL_COMMUNITY): Payer: Self-pay

## 2021-03-19 MED ORDER — AMPHETAMINE-DEXTROAMPHET ER 20 MG PO CP24
20.0000 mg | ORAL_CAPSULE | Freq: Every morning | ORAL | 0 refills | Status: DC
  Filled 2021-04-02: qty 30, 30d supply, fill #0

## 2021-03-22 ENCOUNTER — Other Ambulatory Visit (HOSPITAL_COMMUNITY): Payer: Self-pay

## 2021-03-22 MED ORDER — VIBERZI 100 MG PO TABS
100.0000 mg | ORAL_TABLET | Freq: Two times a day (BID) | ORAL | 0 refills | Status: DC
  Filled 2021-03-22 – 2021-04-09 (×2): qty 180, 90d supply, fill #0

## 2021-03-22 NOTE — Progress Notes (Signed)
Chief Complaint:   OBESITY Brian Cunningham is here to discuss his progress with his obesity treatment plan along with follow-up of his obesity related diagnoses. [name] is on the Category 4 Plan and states he is following his eating plan approximately 60% of the time. [name] states he is walking for 30-60 minutes 3-4 times per week.  Today's visit was #: 22 Starting weight: 262 lbs Starting date: 03/12/2019 Today's weight: 229 lbs Today's date: 03/11/2021 Total lbs lost to date: 33 Total lbs lost since last in-office visit: 2  Interim History: Brian Cunningham continues to do well with weight loss. He has increased traveling and had COVID since our last visit, but he still managed to do well with weight loss.  Subjective:   1. Pre-diabetes Brian Cunningham continues to decrease simple carbohydrates in his diet and he is stable on metformin. He denies signs of hypoglycemia.  2. Other specified hypothyroidism Brian Cunningham is due to have labs checked. She denies signs of tremors or palpitations.  3. Other hyperlipidemia Brian Cunningham is doing well with decreasing cholesterol in his diet. He is due for labs.  4. Vitamin D deficiency Brian Cunningham last Vit D level was over-replaced and he is due to have labs checked again.  5. Other depression with emotional eating Brian Cunningham is stable on Topamax and he is doing well decreasing emotional eating behaviors.   6. At risk for dehydration Brian Cunningham is at risk for dehydration due to inadequate water intake.  Assessment/Plan:   1. Pre-diabetes Brian Cunningham will continue to work on weight loss, exercise, and decreasing simple carbohydrates to help decrease the risk of diabetes. We will check labs today, and we will refill metformin for 1 month.  - metFORMIN (GLUCOPHAGE) 500 MG tablet; Take 1 tablet (500 mg total) by mouth 2 (two) times daily with a meal.  Dispense: 60 tablet; Refill: 0 - CMP14+EGFR - Insulin, random - Hemoglobin  A1c  2. Other specified hypothyroidism We will check labs today. Brian Cunningham will continue to follow up as directed. Orders and follow up as documented in patient record.  - T4, free - TSH - T3  3. Other hyperlipidemia Cardiovascular risk and specific lipid/LDL goals reviewed. We discussed several lifestyle modifications today. We will check labs today. Brian Cunningham will continue to work on diet, exercise and weight loss efforts. Orders and follow up as documented in patient record.   - Lipid Panel With LDL/HDL Ratio  4. Vitamin D deficiency Low Vitamin D level contributes to fatigue and are associated with obesity, breast, and colon cancer. We will check labs today. Brian Cunningham will follow-up for routine testing of Vitamin D, at least 2-3 times per year to avoid over-replacement.  - VITAMIN D 25 Hydroxy (Vit-D Deficiency, Fractures)  5. Other depression with emotional eating Behavior modification techniques were discussed today to help Brian Cunningham deal with his emotional/non-hunger eating behaviors. We will refill Topamax for 1 month. Orders and follow up as documented in patient record.   - topiramate (TOPAMAX) 50 MG tablet; Take 1 tablet (50 mg total) by mouth daily.  Dispense: 30 tablet; Refill: 0  6. At risk for dehydration Brian Cunningham was given approximately 15 minutes dehydration prevention counseling today. Brian Cunningham is at risk for dehydration due to weight loss and current medication(s). He was encouraged to hydrate and monitor fluid status to avoid dehydration as well as weight loss plateaus.   7. Obesity with current BMI 29.5 Brian Cunningham is currently in the action stage of change. As such, his goal is to continue with weight loss  efforts. He has agreed to the Category 4 Plan.   We discussed various medication options to help Brian Cunningham with his weight loss efforts and we both agreed to continue Saxenda 3 mg and we will refill for 1 month, and we will refill nano needles  #100 with no refills.  - Insulin Pen Needle 32G X 4 MM MISC; Use as directed with Saxenda daily.  Dispense: 100 each; Refill: 0 - Liraglutide -Weight Management (SAXENDA) 18 MG/3ML SOPN; Inject 3 mg into the skin daily.  Dispense: 15 mL; Refill: 0  Exercise goals: As is.  Behavioral modification strategies: increasing lean protein intake and increasing water intake.  Brian Cunningham has agreed to follow-up with our clinic in 4 weeks. He was informed of the importance of frequent follow-up visits to maximize his success with intensive lifestyle modifications for his multiple health conditions.   Brian Cunningham was informed we would discuss his lab results at his next visit unless there is a critical issue that needs to be addressed sooner. Brian Cunningham agreed to keep his next visit at the agreed upon time to discuss these results.  Objective:   Blood pressure 134/80, pulse 83, temperature 98.3 F (36.8 C), height _0  (1.88 m), weight 229 lb (103.9 kg), SpO2 98 %. Body mass index is 29.4 kg/m.  General: Cooperative, alert, well developed, in no acute distress. HEENT: Conjunctivae and lids unremarkable. Cardiovascular: Regular rhythm.  Lungs: Normal work of breathing. Neurologic: No focal deficits.   Lab Results  Component Value Date   CREATININE 0.98 03/11/2021   BUN 20 03/11/2021   NA 141 03/11/2021   K 4.3 03/11/2021   CL 101 03/11/2021   CO2 25 03/11/2021   Lab Results  Component Value Date   ALT 34 03/11/2021   AST 25 03/11/2021   ALKPHOS 66 03/11/2021   BILITOT 0.7 03/11/2021   Lab Results  Component Value Date   HGBA1C 5.1 03/11/2021   HGBA1C 5.1 06/10/2020   HGBA1C 4.8 12/24/2019   HGBA1C 4.8 08/13/2019   HGBA1C 5.2 03/12/2019   Lab Results  Component Value Date   INSULIN 13.8 03/11/2021   INSULIN 9.6 06/10/2020   INSULIN 13.0 12/24/2019   INSULIN 18.1 08/13/2019   INSULIN 31.1 (H) 03/12/2019   Lab Results  Component Value Date   TSH 1.890 03/11/2021   Lab  Results  Component Value Date   CHOL 191 03/11/2021   HDL 50 03/11/2021   LDLCALC 122 (H) 03/11/2021   TRIG 106 03/11/2021   Lab Results  Component Value Date   VD25OH 46.4 03/11/2021   VD25OH 107.0 (H) 06/10/2020   VD25OH 97.0 12/24/2019   Lab Results  Component Value Date   WBC 5.4 03/12/2019   HGB 16.9 03/12/2019   HCT 50.1 03/12/2019   MCV 101 (H) 03/12/2019   No results found for: IRON, TIBC, FERRITIN  Attestation Statements:   Reviewed by clinician on day of visit: allergies, medications, problem list, medical history, surgical history, family history, social history, and previous encounter notes.   I, Trixie Dredge, am acting as transcriptionist for Dennard Nip, MD.  I have reviewed the above documentation for accuracy and completeness, and I agree with the above. -  Dennard Nip, MD

## 2021-03-26 ENCOUNTER — Other Ambulatory Visit (HOSPITAL_COMMUNITY): Payer: Self-pay

## 2021-03-29 DIAGNOSIS — Z7984 Long term (current) use of oral hypoglycemic drugs: Secondary | ICD-10-CM | POA: Diagnosis not present

## 2021-03-29 DIAGNOSIS — E079 Disorder of thyroid, unspecified: Secondary | ICD-10-CM | POA: Diagnosis not present

## 2021-03-29 DIAGNOSIS — E7800 Pure hypercholesterolemia, unspecified: Secondary | ICD-10-CM | POA: Diagnosis not present

## 2021-03-29 DIAGNOSIS — R197 Diarrhea, unspecified: Secondary | ICD-10-CM | POA: Diagnosis not present

## 2021-03-29 DIAGNOSIS — Z1211 Encounter for screening for malignant neoplasm of colon: Secondary | ICD-10-CM | POA: Diagnosis not present

## 2021-03-29 DIAGNOSIS — K219 Gastro-esophageal reflux disease without esophagitis: Secondary | ICD-10-CM | POA: Diagnosis not present

## 2021-03-29 DIAGNOSIS — K5730 Diverticulosis of large intestine without perforation or abscess without bleeding: Secondary | ICD-10-CM | POA: Diagnosis not present

## 2021-03-29 DIAGNOSIS — D125 Benign neoplasm of sigmoid colon: Secondary | ICD-10-CM | POA: Diagnosis not present

## 2021-03-29 DIAGNOSIS — Z9852 Vasectomy status: Secondary | ICD-10-CM | POA: Diagnosis not present

## 2021-03-29 DIAGNOSIS — M1990 Unspecified osteoarthritis, unspecified site: Secondary | ICD-10-CM | POA: Diagnosis not present

## 2021-03-29 DIAGNOSIS — Z9049 Acquired absence of other specified parts of digestive tract: Secondary | ICD-10-CM | POA: Diagnosis not present

## 2021-03-29 DIAGNOSIS — K529 Noninfective gastroenteritis and colitis, unspecified: Secondary | ICD-10-CM | POA: Diagnosis not present

## 2021-04-02 ENCOUNTER — Other Ambulatory Visit (HOSPITAL_COMMUNITY): Payer: Self-pay

## 2021-04-05 ENCOUNTER — Other Ambulatory Visit (HOSPITAL_COMMUNITY): Payer: Self-pay

## 2021-04-06 ENCOUNTER — Other Ambulatory Visit (HOSPITAL_COMMUNITY): Payer: Self-pay

## 2021-04-09 ENCOUNTER — Other Ambulatory Visit (HOSPITAL_COMMUNITY): Payer: Self-pay

## 2021-04-13 ENCOUNTER — Other Ambulatory Visit (HOSPITAL_COMMUNITY): Payer: Self-pay

## 2021-04-15 ENCOUNTER — Other Ambulatory Visit (HOSPITAL_COMMUNITY): Payer: Self-pay

## 2021-04-15 ENCOUNTER — Ambulatory Visit (INDEPENDENT_AMBULATORY_CARE_PROVIDER_SITE_OTHER): Payer: 59 | Admitting: Family Medicine

## 2021-04-21 ENCOUNTER — Other Ambulatory Visit (HOSPITAL_COMMUNITY): Payer: Self-pay

## 2021-04-21 ENCOUNTER — Encounter (INDEPENDENT_AMBULATORY_CARE_PROVIDER_SITE_OTHER): Payer: Self-pay | Admitting: Family Medicine

## 2021-04-21 ENCOUNTER — Other Ambulatory Visit: Payer: Self-pay

## 2021-04-21 ENCOUNTER — Ambulatory Visit (INDEPENDENT_AMBULATORY_CARE_PROVIDER_SITE_OTHER): Payer: 59 | Admitting: Family Medicine

## 2021-04-21 VITALS — BP 112/73 | HR 80 | Temp 98.6°F | Ht 74.0 in | Wt 239.0 lb

## 2021-04-21 DIAGNOSIS — E669 Obesity, unspecified: Secondary | ICD-10-CM | POA: Diagnosis not present

## 2021-04-21 DIAGNOSIS — Z6833 Body mass index (BMI) 33.0-33.9, adult: Secondary | ICD-10-CM | POA: Diagnosis not present

## 2021-04-21 DIAGNOSIS — Z9189 Other specified personal risk factors, not elsewhere classified: Secondary | ICD-10-CM

## 2021-04-21 DIAGNOSIS — R7303 Prediabetes: Secondary | ICD-10-CM | POA: Diagnosis not present

## 2021-04-21 DIAGNOSIS — F3289 Other specified depressive episodes: Secondary | ICD-10-CM | POA: Diagnosis not present

## 2021-04-21 MED ORDER — INSULIN PEN NEEDLE 32G X 4 MM MISC
0 refills | Status: AC
Start: 2021-04-21 — End: ?
  Filled 2021-04-21: qty 100, fill #0
  Filled 2021-11-08: qty 100, 90d supply, fill #0

## 2021-04-21 MED ORDER — METFORMIN HCL 500 MG PO TABS
500.0000 mg | ORAL_TABLET | Freq: Two times a day (BID) | ORAL | 0 refills | Status: DC
  Filled 2021-04-21: qty 60, 30d supply, fill #0

## 2021-04-21 MED ORDER — TIRZEPATIDE 15 MG/0.5ML ~~LOC~~ SOAJ
15.0000 mg | SUBCUTANEOUS | 0 refills | Status: DC
  Filled 2021-04-21: qty 6, 84d supply, fill #0
  Filled 2021-04-22: qty 2, 28d supply, fill #0

## 2021-04-21 MED ORDER — TOPIRAMATE 50 MG PO TABS
50.0000 mg | ORAL_TABLET | Freq: Every day | ORAL | 0 refills | Status: DC
  Filled 2021-04-21: qty 30, 30d supply, fill #0

## 2021-04-22 ENCOUNTER — Other Ambulatory Visit (HOSPITAL_COMMUNITY): Payer: Self-pay

## 2021-04-22 NOTE — Progress Notes (Signed)
Chief Complaint:   OBESITY Brian Cunningham is here to discuss his progress with his obesity treatment plan along with follow-up of his obesity related diagnoses. Brian Cunningham is on the Category 4 Plan and states he is following his eating plan approximately 50% of the time. Brian Cunningham states he is walking for 60-90 minutes 2-3 times per week.  Today's visit was #: 23 Starting weight: 262 lbs Starting date: 03/12/2019 Today's weight: 239 lbs Today's date: 04/21/2021 Total lbs lost to date: 23 Total lbs lost since last in-office visit: 0  Interim History: Brian Cunningham notes increased stress and comfort eating in the last few weeks. He is stable on Saxenda but he is ready to get back on track.  Subjective:   1. Pre-diabetes Brian Cunningham is stable on metformin and Saxenda, but he still notes some polyphagia and his portions hav increased.  2. Other depression with emotional eating Brian Cunningham is stable on Topamax overall, and he had some increased stress which increased stress eating, but he is working on this now.  3. At risk for nausea Brian Cunningham is at risk for nausea due to starting Brian Cunningham.  Assessment/Plan:   1. Pre-diabetes Brian Cunningham agreed to start Mounjaro 15 mg q weekly with no refills, and we will refill metformin for 1 month. He will continue to work on weight loss, exercise, and decreasing simple carbohydrates to help decrease the risk of diabetes.   - metFORMIN (GLUCOPHAGE) 500 MG tablet; Take 1 tablet (500 mg total) by mouth 2 (two) times daily with a meal.  Dispense: 60 tablet; Refill: 0 - tirzepatide (MOUNJARO) 15 MG/0.5ML Pen; Inject 15 mg into the skin once a week.  Dispense: 6 mL; Refill: 0  2. Other depression with emotional eating Behavior modification techniques were discussed today to help Brian Cunningham deal with his emotional/non-hunger eating behaviors. We will refill Topamax for 1 month. Orders and follow up as documented in patient record.   - topiramate  (TOPAMAX) 50 MG tablet; Take 1 tablet (50 mg total) by mouth daily.  Dispense: 30 tablet; Refill: 0  3. At risk for nausea Brian Cunningham was given approximately 15 minutes of nausea prevention counseling today. Brian Cunningham is at risk for nausea due to his new or current medication. He was encouraged to titrate his medication slowly, make sure to stay hydrated, eat smaller portions throughout the day, and avoid high fat meals.   4. Obesity with current BMI 30.8 Brian Cunningham is currently in the action stage of change. As such, his goal is to continue with weight loss efforts. He has agreed to keeping a food journal and adhering to recommended goals of 1500-2000 calories and 100+ grams of protein daily.   We discussed Saxenda to help Brian Cunningham with his weight loss efforts and we both agreed to start Brian Cunningham, and we will refill pen needles #100 with no refills.  - Insulin Pen Needle 32G X 4 MM MISC; Use as directed with Saxenda daily.  Dispense: 100 each; Refill: 0  Exercise goals: As is.  Behavioral modification strategies: increasing lean protein intake.  Brian Cunningham has agreed to follow-up with our clinic in 3 weeks. He was informed of the importance of frequent follow-up visits to maximize his success with intensive lifestyle modifications for his multiple health conditions.   Objective:   Blood pressure 112/73, pulse 80, temperature 98.6 F (37 C), height '6\' 2"'$  (1.88 m), weight 239 lb (108.4 kg), SpO2 97 %. Body mass index is 30.69 kg/m.  General: Cooperative, alert, well developed, in no acute distress.  HEENT: Conjunctivae and lids unremarkable. Cardiovascular: Regular rhythm.  Lungs: Normal work of breathing. Neurologic: No focal deficits.   Lab Results  Component Value Date   CREATININE 0.98 03/11/2021   BUN 20 03/11/2021   NA 141 03/11/2021   K 4.3 03/11/2021   CL 101 03/11/2021   CO2 25 03/11/2021   Lab Results  Component Value Date   ALT 34 03/11/2021   AST 25  03/11/2021   ALKPHOS 66 03/11/2021   BILITOT 0.7 03/11/2021   Lab Results  Component Value Date   HGBA1C 5.1 03/11/2021   HGBA1C 5.1 06/10/2020   HGBA1C 4.8 12/24/2019   HGBA1C 4.8 08/13/2019   HGBA1C 5.2 03/12/2019   Lab Results  Component Value Date   INSULIN 13.8 03/11/2021   INSULIN 9.6 06/10/2020   INSULIN 13.0 12/24/2019   INSULIN 18.1 08/13/2019   INSULIN 31.1 (H) 03/12/2019   Lab Results  Component Value Date   TSH 1.890 03/11/2021   Lab Results  Component Value Date   CHOL 191 03/11/2021   HDL 50 03/11/2021   LDLCALC 122 (H) 03/11/2021   TRIG 106 03/11/2021   Lab Results  Component Value Date   VD25OH 46.4 03/11/2021   VD25OH 107.0 (H) 06/10/2020   VD25OH 97.0 12/24/2019   Lab Results  Component Value Date   WBC 5.4 03/12/2019   HGB 16.9 03/12/2019   HCT 50.1 03/12/2019   MCV 101 (H) 03/12/2019   No results found for: IRON, TIBC, FERRITIN  Attestation Statements:   Reviewed by clinician on day of visit: allergies, medications, problem list, medical history, surgical history, family history, social history, and previous encounter notes.   I, Trixie Dredge, am acting as transcriptionist for Dennard Nip, MD.  I have reviewed the above documentation for accuracy and completeness, and I agree with the above. -  Dennard Nip, MD

## 2021-04-23 ENCOUNTER — Other Ambulatory Visit (HOSPITAL_COMMUNITY): Payer: Self-pay

## 2021-04-25 ENCOUNTER — Other Ambulatory Visit (HOSPITAL_COMMUNITY): Payer: Self-pay

## 2021-04-26 ENCOUNTER — Other Ambulatory Visit (HOSPITAL_COMMUNITY): Payer: Self-pay

## 2021-04-26 ENCOUNTER — Encounter (INDEPENDENT_AMBULATORY_CARE_PROVIDER_SITE_OTHER): Payer: Self-pay

## 2021-04-27 ENCOUNTER — Other Ambulatory Visit (HOSPITAL_COMMUNITY): Payer: Self-pay

## 2021-04-27 MED ORDER — OMEPRAZOLE 40 MG PO CPDR
40.0000 mg | DELAYED_RELEASE_CAPSULE | Freq: Every day | ORAL | 0 refills | Status: DC
  Filled 2021-04-27: qty 90, 90d supply, fill #0

## 2021-04-27 MED ORDER — ATORVASTATIN CALCIUM 40 MG PO TABS
40.0000 mg | ORAL_TABLET | Freq: Every day | ORAL | 0 refills | Status: DC
  Filled 2021-04-27: qty 90, 90d supply, fill #0

## 2021-04-27 MED ORDER — LEVOTHYROXINE SODIUM 88 MCG PO TABS
88.0000 ug | ORAL_TABLET | Freq: Every day | ORAL | 0 refills | Status: AC
  Filled 2021-04-27: qty 90, 90d supply, fill #0

## 2021-04-28 ENCOUNTER — Other Ambulatory Visit (HOSPITAL_COMMUNITY): Payer: Self-pay

## 2021-04-29 ENCOUNTER — Other Ambulatory Visit (HOSPITAL_COMMUNITY): Payer: Self-pay

## 2021-04-29 MED ORDER — AMPHETAMINE-DEXTROAMPHET ER 20 MG PO CP24
20.0000 mg | ORAL_CAPSULE | Freq: Every morning | ORAL | 0 refills | Status: DC
  Filled 2021-05-06: qty 30, 30d supply, fill #0

## 2021-05-06 ENCOUNTER — Other Ambulatory Visit (HOSPITAL_COMMUNITY): Payer: Self-pay

## 2021-05-10 ENCOUNTER — Other Ambulatory Visit (HOSPITAL_COMMUNITY): Payer: Self-pay

## 2021-05-11 ENCOUNTER — Other Ambulatory Visit (HOSPITAL_COMMUNITY): Payer: Self-pay

## 2021-05-11 ENCOUNTER — Other Ambulatory Visit (HOSPITAL_COMMUNITY): Payer: Self-pay | Admitting: Pharmacist

## 2021-05-11 MED ORDER — CARESTART COVID-19 HOME TEST VI KIT
PACK | 0 refills | Status: DC
  Filled 2021-05-11: qty 4, 4d supply, fill #0

## 2021-05-12 ENCOUNTER — Other Ambulatory Visit (HOSPITAL_COMMUNITY): Payer: Self-pay

## 2021-05-12 ENCOUNTER — Telehealth (INDEPENDENT_AMBULATORY_CARE_PROVIDER_SITE_OTHER): Payer: 59 | Admitting: Adult Health

## 2021-05-12 ENCOUNTER — Encounter (INDEPENDENT_AMBULATORY_CARE_PROVIDER_SITE_OTHER): Payer: Self-pay | Admitting: Adult Health

## 2021-05-12 ENCOUNTER — Other Ambulatory Visit: Payer: Self-pay

## 2021-05-12 DIAGNOSIS — R7303 Prediabetes: Secondary | ICD-10-CM

## 2021-05-12 DIAGNOSIS — E669 Obesity, unspecified: Secondary | ICD-10-CM

## 2021-05-12 DIAGNOSIS — F3289 Other specified depressive episodes: Secondary | ICD-10-CM | POA: Diagnosis not present

## 2021-05-12 DIAGNOSIS — Z6833 Body mass index (BMI) 33.0-33.9, adult: Secondary | ICD-10-CM | POA: Diagnosis not present

## 2021-05-12 DIAGNOSIS — E66811 Obesity, class 1: Secondary | ICD-10-CM

## 2021-05-12 MED ORDER — TOPIRAMATE 50 MG PO TABS
50.0000 mg | ORAL_TABLET | Freq: Every day | ORAL | 0 refills | Status: DC
  Filled 2021-05-12 – 2021-06-24 (×2): qty 30, 30d supply, fill #0

## 2021-05-12 MED ORDER — TIRZEPATIDE 15 MG/0.5ML ~~LOC~~ SOAJ
15.0000 mg | SUBCUTANEOUS | 0 refills | Status: DC
  Filled 2021-05-12: qty 2, 28d supply, fill #0
  Filled 2021-06-14: qty 2, 28d supply, fill #1

## 2021-05-12 MED ORDER — METFORMIN HCL 500 MG PO TABS
500.0000 mg | ORAL_TABLET | Freq: Two times a day (BID) | ORAL | 0 refills | Status: DC
Start: 2021-05-12 — End: 2021-07-08
  Filled 2021-05-12: qty 60, 30d supply, fill #0

## 2021-05-13 ENCOUNTER — Other Ambulatory Visit (HOSPITAL_COMMUNITY): Payer: Self-pay

## 2021-05-13 NOTE — Progress Notes (Signed)
TeleHealth Visit:  Due to the COVID-19 pandemic, this visit was completed with telemedicine (audio/video) technology to reduce patient and provider exposure as well as to preserve personal protective equipment.   Brian Cunningham has verbally consented to this TeleHealth visit. The patient is located at home, the provider is located at the [facility] office. The participants in this visit include the listed provider and patient. The visit was conducted today via MyChart video.  Chief Complaint: OBESITY Brian Cunningham is here to discuss his progress with his obesity treatment plan along with follow-up of his obesity related diagnoses. Brian Cunningham is on keeping a food journal and adhering to recommended goals of 1500-2000 calories and 100+ grams of protein and states he is following his eating plan approximately 60% of the time. Brian Cunningham states he is hiking for 60-90 minutes 2 times per week.  Today's visit was #: 24 Starting weight: 262 lbs Starting date: 03/12/2019  Interim History: Brian Cunningham has a sick child at home - fever, body aches, congestion. Home antigen COVID-19 negative.  He denies any personal acute sx's of illness at present. He is tracking intake and hitting calorie/protein goals - both estimated at 60%.  Subjective:   1. Pre-diabetes He was started on Mounjaro 15 mg at last office visit - 04/21/2021. He continues with metformin 500 mg twice daily. He was previously on Saxenda, however, experienced polyphagia with this type of GLP-1 therapy. Since change from GLP- to GIP/GLP-1, he reports significant decrease in appetite.  Lab Results  Component Value Date   HGBA1C 5.1 03/11/2021   Lab Results  Component Value Date   INSULIN 13.8 03/11/2021   INSULIN 9.6 06/10/2020   INSULIN 13.0 12/24/2019   INSULIN 18.1 08/13/2019   INSULIN 31.1 (H) 03/12/2019   2. Other depression with emotional eating He is on topiramate 50 mg daily - he takes at lunch. He  denies daytime sedation with topiramate. He reports stable mood, denies SI/HI.  Assessment/Plan:   1. Pre-diabetes Refill metformin 500 mg BID. Refill Mounjaro 15 mg once weekly.  - Refill tirzepatide (MOUNJARO) 15 MG/0.5ML Pen; Inject 15 mg into the skin once a week.  Dispense: 6 mL; Refill: 0 - Refill metFORMIN (GLUCOPHAGE) 500 MG tablet; Take 1 tablet (500 mg total) by mouth 2 (two) times daily with a meal.  Dispense: 60 tablet; Refill: 0  2. Other depression with emotional eating Refill topiramate 50 mg daily.  - Refill topiramate (TOPAMAX) 50 MG tablet; Take 1 tablet (50 mg total) by mouth daily.  Dispense: 30 tablet; Refill: 0  3. Obesity with current BMI 30.8  Brian Cunningham is currently in the action stage of change. As such, his goal is to continue with weight loss efforts. He has agreed to keeping a food journal and adhering to recommended goals of 1500-2000 calories and 100 grams of protein.   Exercise goals:  As is.  Behavioral modification strategies: increasing lean protein intake, decreasing simple carbohydrates, meal planning and cooking strategies, keeping healthy foods in the home, and planning for success.  Brian Cunningham has agreed to follow-up with our clinic in 3 weeks. He was informed of the importance of frequent follow-up visits to maximize his success with intensive lifestyle modifications for his multiple health conditions.  Objective:   VITALS: Per patient if applicable, see vitals. GENERAL: Alert and in no acute distress. CARDIOPULMONARY: No increased WOB. Speaking in clear sentences.  PSYCH: Pleasant and cooperative. Speech normal rate and rhythm. Affect is appropriate. Insight and judgement are appropriate. Attention  is focused, linear, and appropriate.  NEURO: Oriented as arrived to appointment on time with no prompting.   Lab Results  Component Value Date   CREATININE 0.98 03/11/2021   BUN 20 03/11/2021   NA 141 03/11/2021   K 4.3 03/11/2021   CL  101 03/11/2021   CO2 25 03/11/2021   Lab Results  Component Value Date   ALT 34 03/11/2021   AST 25 03/11/2021   ALKPHOS 66 03/11/2021   BILITOT 0.7 03/11/2021   Lab Results  Component Value Date   HGBA1C 5.1 03/11/2021   HGBA1C 5.1 06/10/2020   HGBA1C 4.8 12/24/2019   HGBA1C 4.8 08/13/2019   HGBA1C 5.2 03/12/2019   Lab Results  Component Value Date   INSULIN 13.8 03/11/2021   INSULIN 9.6 06/10/2020   INSULIN 13.0 12/24/2019   INSULIN 18.1 08/13/2019   INSULIN 31.1 (H) 03/12/2019   Lab Results  Component Value Date   TSH 1.890 03/11/2021   Lab Results  Component Value Date   CHOL 191 03/11/2021   HDL 50 03/11/2021   LDLCALC 122 (H) 03/11/2021   TRIG 106 03/11/2021   Lab Results  Component Value Date   VD25OH 46.4 03/11/2021   VD25OH 107.0 (H) 06/10/2020   VD25OH 97.0 12/24/2019   Lab Results  Component Value Date   WBC 5.4 03/12/2019   HGB 16.9 03/12/2019   HCT 50.1 03/12/2019   MCV 101 (H) 03/12/2019   Attestation Statements:   Reviewed by clinician on day of visit: allergies, medications, problem list, medical history, surgical history, family history, social history, and previous encounter notes.  Time spent on visit including pre-visit chart review and post-visit charting and care was 28 minutes.   I, Water quality scientist, CMA, am acting as Location manager for Mina Marble, NP.  I have reviewed the above documentation for accuracy and completeness, and I agree with the above. - Jaydis Duchene d. Suezette Lafave, NP-C

## 2021-05-17 ENCOUNTER — Other Ambulatory Visit (HOSPITAL_COMMUNITY): Payer: Self-pay

## 2021-05-23 ENCOUNTER — Other Ambulatory Visit (HOSPITAL_COMMUNITY): Payer: Self-pay

## 2021-05-24 ENCOUNTER — Other Ambulatory Visit (HOSPITAL_COMMUNITY): Payer: Self-pay

## 2021-05-25 ENCOUNTER — Other Ambulatory Visit (HOSPITAL_COMMUNITY): Payer: Self-pay

## 2021-05-25 MED FILL — Lurasidone HCl Tab 40 MG: ORAL | 90 days supply | Qty: 90 | Fill #0 | Status: AC

## 2021-06-03 ENCOUNTER — Other Ambulatory Visit (HOSPITAL_COMMUNITY): Payer: Self-pay

## 2021-06-06 ENCOUNTER — Other Ambulatory Visit (HOSPITAL_COMMUNITY): Payer: Self-pay

## 2021-06-07 ENCOUNTER — Other Ambulatory Visit (HOSPITAL_COMMUNITY): Payer: Self-pay

## 2021-06-07 MED ORDER — GABAPENTIN 300 MG PO CAPS
300.0000 mg | ORAL_CAPSULE | Freq: Three times a day (TID) | ORAL | 3 refills | Status: AC
  Filled 2021-06-07: qty 90, 30d supply, fill #0
  Filled 2021-07-09: qty 90, 30d supply, fill #1
  Filled 2021-08-05 – 2021-08-09 (×2): qty 90, 30d supply, fill #2

## 2021-06-08 ENCOUNTER — Other Ambulatory Visit (HOSPITAL_COMMUNITY): Payer: Self-pay

## 2021-06-08 MED ORDER — CARESTART COVID-19 HOME TEST VI KIT
PACK | 0 refills | Status: DC
  Filled 2021-06-08: qty 4, 4d supply, fill #0

## 2021-06-10 DIAGNOSIS — Z20822 Contact with and (suspected) exposure to covid-19: Secondary | ICD-10-CM | POA: Diagnosis not present

## 2021-06-14 ENCOUNTER — Other Ambulatory Visit (HOSPITAL_COMMUNITY): Payer: Self-pay

## 2021-06-15 ENCOUNTER — Other Ambulatory Visit (HOSPITAL_COMMUNITY): Payer: Self-pay

## 2021-06-15 ENCOUNTER — Other Ambulatory Visit: Payer: Self-pay | Admitting: Student in an Organized Health Care Education/Training Program

## 2021-06-15 DIAGNOSIS — M7918 Myalgia, other site: Secondary | ICD-10-CM

## 2021-06-15 DIAGNOSIS — G501 Atypical facial pain: Secondary | ICD-10-CM

## 2021-06-15 MED ORDER — TRAZODONE HCL 100 MG PO TABS
50.0000 mg | ORAL_TABLET | Freq: Every evening | ORAL | 0 refills | Status: DC
  Filled 2021-06-15: qty 90, 90d supply, fill #0

## 2021-06-15 MED ORDER — LATUDA 40 MG PO TABS
40.0000 mg | ORAL_TABLET | Freq: Every day | ORAL | 0 refills | Status: DC
  Filled 2021-06-15: qty 90, 90d supply, fill #0

## 2021-06-15 MED ORDER — MIRTAZAPINE 15 MG PO TABS
7.5000 mg | ORAL_TABLET | Freq: Every day | ORAL | 0 refills | Status: DC
  Filled 2021-06-15: qty 90, 90d supply, fill #0

## 2021-06-15 MED ORDER — VENLAFAXINE HCL ER 75 MG PO CP24
75.0000 mg | ORAL_CAPSULE | Freq: Every morning | ORAL | 0 refills | Status: DC
  Filled 2021-06-15: qty 90, 90d supply, fill #0

## 2021-06-15 MED ORDER — DIVALPROEX SODIUM ER 500 MG PO TB24
1000.0000 mg | ORAL_TABLET | Freq: Every day | ORAL | 0 refills | Status: AC
  Filled 2021-06-15: qty 180, 90d supply, fill #0

## 2021-06-16 ENCOUNTER — Other Ambulatory Visit (HOSPITAL_COMMUNITY): Payer: Self-pay

## 2021-06-16 MED ORDER — AMPHETAMINE-DEXTROAMPHET ER 20 MG PO CP24
20.0000 mg | ORAL_CAPSULE | Freq: Every morning | ORAL | 0 refills | Status: DC
  Filled 2021-06-16: qty 30, 30d supply, fill #0

## 2021-06-17 ENCOUNTER — Other Ambulatory Visit (HOSPITAL_COMMUNITY): Payer: Self-pay

## 2021-06-17 MED ORDER — LATUDA 40 MG PO TABS: 40.0000 mg | ORAL_TABLET | Freq: Every day | ORAL | 0 refills | Status: DC

## 2021-06-17 MED ORDER — LATUDA 40 MG PO TABS
40.0000 mg | ORAL_TABLET | Freq: Every day | ORAL | 0 refills | Status: DC
  Filled 2021-06-17: qty 90, 90d supply, fill #0

## 2021-06-24 ENCOUNTER — Other Ambulatory Visit (HOSPITAL_COMMUNITY): Payer: Self-pay

## 2021-06-29 ENCOUNTER — Other Ambulatory Visit (HOSPITAL_COMMUNITY): Payer: Self-pay

## 2021-06-30 ENCOUNTER — Other Ambulatory Visit (HOSPITAL_COMMUNITY): Payer: Self-pay

## 2021-07-06 ENCOUNTER — Other Ambulatory Visit (HOSPITAL_COMMUNITY): Payer: Self-pay

## 2021-07-07 ENCOUNTER — Ambulatory Visit (INDEPENDENT_AMBULATORY_CARE_PROVIDER_SITE_OTHER): Payer: 59 | Admitting: Family Medicine

## 2021-07-08 ENCOUNTER — Ambulatory Visit (INDEPENDENT_AMBULATORY_CARE_PROVIDER_SITE_OTHER): Payer: 59 | Admitting: Family Medicine

## 2021-07-08 ENCOUNTER — Other Ambulatory Visit: Payer: Self-pay

## 2021-07-08 ENCOUNTER — Encounter (INDEPENDENT_AMBULATORY_CARE_PROVIDER_SITE_OTHER): Payer: Self-pay | Admitting: Family Medicine

## 2021-07-08 ENCOUNTER — Other Ambulatory Visit (HOSPITAL_COMMUNITY): Payer: Self-pay

## 2021-07-08 VITALS — BP 113/76 | HR 80 | Temp 98.0°F | Ht 74.0 in | Wt 234.0 lb

## 2021-07-08 DIAGNOSIS — E559 Vitamin D deficiency, unspecified: Secondary | ICD-10-CM

## 2021-07-08 DIAGNOSIS — E669 Obesity, unspecified: Secondary | ICD-10-CM | POA: Diagnosis not present

## 2021-07-08 DIAGNOSIS — R7303 Prediabetes: Secondary | ICD-10-CM | POA: Diagnosis not present

## 2021-07-08 DIAGNOSIS — Z6833 Body mass index (BMI) 33.0-33.9, adult: Secondary | ICD-10-CM | POA: Diagnosis not present

## 2021-07-08 DIAGNOSIS — F3289 Other specified depressive episodes: Secondary | ICD-10-CM | POA: Diagnosis not present

## 2021-07-08 MED ORDER — TOPIRAMATE 50 MG PO TABS
50.0000 mg | ORAL_TABLET | Freq: Every day | ORAL | 0 refills | Status: DC
  Filled 2021-07-08 – 2021-07-31 (×2): qty 30, 30d supply, fill #0

## 2021-07-08 MED ORDER — TIRZEPATIDE 15 MG/0.5ML ~~LOC~~ SOAJ
15.0000 mg | SUBCUTANEOUS | 0 refills | Status: DC
  Filled 2021-07-08: qty 6, 84d supply, fill #0

## 2021-07-08 MED ORDER — METFORMIN HCL 500 MG PO TABS
500.0000 mg | ORAL_TABLET | Freq: Two times a day (BID) | ORAL | 0 refills | Status: DC
  Filled 2021-07-08: qty 60, 30d supply, fill #0

## 2021-07-09 ENCOUNTER — Other Ambulatory Visit (HOSPITAL_COMMUNITY): Payer: Self-pay

## 2021-07-09 DIAGNOSIS — F319 Bipolar disorder, unspecified: Secondary | ICD-10-CM | POA: Diagnosis not present

## 2021-07-09 DIAGNOSIS — F902 Attention-deficit hyperactivity disorder, combined type: Secondary | ICD-10-CM | POA: Diagnosis not present

## 2021-07-09 DIAGNOSIS — G4700 Insomnia, unspecified: Secondary | ICD-10-CM | POA: Diagnosis not present

## 2021-07-09 MED ORDER — AMPHETAMINE-DEXTROAMPHET ER 20 MG PO CP24
20.0000 mg | ORAL_CAPSULE | Freq: Every morning | ORAL | 0 refills | Status: DC
  Filled 2021-07-16: qty 30, 30d supply, fill #0

## 2021-07-15 NOTE — Progress Notes (Signed)
Chief Complaint:   OBESITY Brian Cunningham is here to discuss his progress with his obesity treatment plan along with follow-up of his obesity related diagnoses. Brian Cunningham is on keeping a food journal and adhering to recommended goals of 1500-2000 calories and 100 grams of protein daily and states he is following his eating plan approximately 30% of the time. Brian Cunningham states he is doing 0 minutes 0 times per week.  Today's visit was #: 25 Starting weight: 262 lbs Starting date: 03/12/2019 Today's weight: 234 lbs Today's date: 07/08/2021 Total lbs lost to date: 28 Total lbs lost since last in-office visit: 5  Interim History: Brian Cunningham's last visit was 3 months ago. He continues to do well with diet and weight loss. He is studying as well as working, and this has limited his exercise.  Subjective:   1. Pre-diabetes Brian Cunningham is doing well on metformin and Mounjaro, and he denies nausea, vomiting, or hypoglycemia.  2. Other depression with emotional eating Brian Cunningham is working on [employer] behavior, and he has had some challenges but he is doing better overall. No side effects were noted.  Assessment/Plan:   1. Pre-diabetes Brian Cunningham will continue to work on weight loss, exercise, and decreasing simple carbohydrates to help decrease the risk of diabetes. We will refill Mounjaro and metformin for 1 month.  - metFORMIN (GLUCOPHAGE) 500 MG tablet; Take 1 tablet (500 mg total) by mouth 2 (two) times daily with a meal.  Dispense: 60 tablet; Refill: 0 - tirzepatide (MOUNJARO) 15 MG/0.5ML Pen; Inject 15 mg into the skin once a week.  Dispense: 6 mL; Refill: 0  2. Other depression with emotional eating Behavior modification techniques were discussed today to help Brian Cunningham deal with his emotional/non-hunger eating behaviors. We will refill Topamax for 1 month. Orders and follow up as documented in patient record.   - topiramate (TOPAMAX) 50 MG tablet; Take  1 tablet (50 mg total) by mouth daily.  Dispense: 30 tablet; Refill: 0  3. Obesity with current BMI 37.0 Brian Cunningham is currently in the action stage of change. As such, his goal is to continue with weight loss efforts. He has agreed to the Category 4 Plan.   Behavioral modification strategies: increasing lean protein intake.  Brian Cunningham has agreed to follow-up with our clinic in 4 weeks. He was informed of the importance of frequent follow-up visits to maximize his success with intensive lifestyle modifications for his multiple health conditions.   Objective:   Blood pressure 113/76, pulse 80, temperature 98 F (36.7 C), height 6\' 2"  (1.88 m), weight 234 lb (106.1 kg), SpO2 97 %. Body mass index is 30.04 kg/m.  General: Cooperative, alert, well developed, in no acute distress. HEENT: Conjunctivae and lids unremarkable. Cardiovascular: Regular rhythm.  Lungs: Normal work of breathing. Neurologic: No focal deficits.   Lab Results  Component Value Date   CREATININE 0.98 03/11/2021   BUN 20 03/11/2021   NA 141 03/11/2021   K 4.3 03/11/2021   CL 101 03/11/2021   CO2 25 03/11/2021   Lab Results  Component Value Date   ALT 34 03/11/2021   AST 25 03/11/2021   ALKPHOS 66 03/11/2021   BILITOT 0.7 03/11/2021   Lab Results  Component Value Date   HGBA1C 5.1 03/11/2021   HGBA1C 5.1 06/10/2020   HGBA1C 4.8 12/24/2019   HGBA1C 4.8 08/13/2019   HGBA1C 5.2 03/12/2019   Lab Results  Component Value Date   INSULIN 13.8 03/11/2021   INSULIN 9.6 06/10/2020   INSULIN  13.0 12/24/2019   INSULIN 18.1 08/13/2019   INSULIN 31.1 (H) 03/12/2019   Lab Results  Component Value Date   TSH 1.890 03/11/2021   Lab Results  Component Value Date   CHOL 191 03/11/2021   HDL 50 03/11/2021   LDLCALC 122 (H) 03/11/2021   TRIG 106 03/11/2021   Lab Results  Component Value Date   VD25OH 46.4 03/11/2021   VD25OH 107.0 (H) 06/10/2020   VD25OH 97.0 12/24/2019   Lab Results  Component  Value Date   WBC 5.4 03/12/2019   HGB 16.9 03/12/2019   HCT 50.1 03/12/2019   MCV 101 (H) 03/12/2019   No results found for: IRON, TIBC, FERRITIN  Attestation Statements:   Reviewed by clinician on day of visit: allergies, medications, problem list, medical history, surgical history, family history, social history, and previous encounter notes.  Time spent on visit including pre-visit chart review and post-visit care and charting was 40 minutes.    I, Trixie Dredge, am acting as transcriptionist for Dennard Nip, MD.  I have reviewed the above documentation for accuracy and completeness, and I agree with the above. -  Dennard Nip, MD

## 2021-07-16 ENCOUNTER — Other Ambulatory Visit (HOSPITAL_COMMUNITY): Payer: Self-pay

## 2021-07-16 MED ORDER — ZOSTER VAC RECOMB ADJUVANTED 50 MCG/0.5ML IM SUSR
0.5000 mL | INTRAMUSCULAR | 1 refills | Status: AC
  Filled 2021-07-16: qty 0.5, 1d supply, fill #0
  Filled 2021-09-15: qty 0.5, 1d supply, fill #1

## 2021-07-21 ENCOUNTER — Other Ambulatory Visit (HOSPITAL_COMMUNITY): Payer: Self-pay

## 2021-07-25 ENCOUNTER — Other Ambulatory Visit (HOSPITAL_COMMUNITY): Payer: Self-pay

## 2021-07-27 MED ORDER — OMEPRAZOLE 40 MG PO CPDR
40.0000 mg | DELAYED_RELEASE_CAPSULE | Freq: Every day | ORAL | 0 refills | Status: DC
  Filled 2021-07-27: qty 90, 90d supply, fill #0

## 2021-07-27 MED ORDER — ATORVASTATIN CALCIUM 40 MG PO TABS
40.0000 mg | ORAL_TABLET | Freq: Every day | ORAL | 0 refills | Status: DC
  Filled 2021-07-27: qty 90, 90d supply, fill #0

## 2021-07-28 ENCOUNTER — Other Ambulatory Visit (HOSPITAL_COMMUNITY): Payer: Self-pay

## 2021-08-02 ENCOUNTER — Other Ambulatory Visit (HOSPITAL_COMMUNITY): Payer: Self-pay

## 2021-08-05 ENCOUNTER — Other Ambulatory Visit (HOSPITAL_COMMUNITY): Payer: Self-pay

## 2021-08-05 ENCOUNTER — Other Ambulatory Visit: Payer: Self-pay

## 2021-08-05 ENCOUNTER — Ambulatory Visit
Admission: RE | Admit: 2021-08-05 | Discharge: 2021-08-05 | Disposition: A | Discharge status: Home / Self Care | Payer: 59 | Source: Ambulatory Visit | Attending: Student in an Organized Health Care Education/Training Program | Admitting: Student in an Organized Health Care Education/Training Program

## 2021-08-05 DIAGNOSIS — R202 Paresthesia of skin: Secondary | ICD-10-CM | POA: Diagnosis not present

## 2021-08-05 DIAGNOSIS — M7918 Myalgia, other site: Secondary | ICD-10-CM | POA: Diagnosis not present

## 2021-08-05 DIAGNOSIS — G501 Atypical facial pain: Secondary | ICD-10-CM | POA: Insufficient documentation

## 2021-08-05 DIAGNOSIS — R519 Headache, unspecified: Secondary | ICD-10-CM | POA: Diagnosis not present

## 2021-08-05 MED ORDER — GADOBUTROL 1 MMOL/ML IV SOLN
10.0000 mL | Freq: Once | INTRAVENOUS | Status: AC | PRN
  Administered 2021-08-05: 10 mL via INTRAVENOUS

## 2021-08-09 ENCOUNTER — Other Ambulatory Visit (HOSPITAL_COMMUNITY): Payer: Self-pay

## 2021-08-09 DIAGNOSIS — G501 Atypical facial pain: Secondary | ICD-10-CM | POA: Diagnosis not present

## 2021-08-09 DIAGNOSIS — G500 Trigeminal neuralgia: Secondary | ICD-10-CM | POA: Diagnosis not present

## 2021-08-09 DIAGNOSIS — M7918 Myalgia, other site: Secondary | ICD-10-CM | POA: Diagnosis not present

## 2021-08-09 DIAGNOSIS — M26622 Arthralgia of left temporomandibular joint: Secondary | ICD-10-CM | POA: Diagnosis not present

## 2021-08-09 MED ORDER — GABAPENTIN 300 MG PO CAPS
300.0000 mg | ORAL_CAPSULE | Freq: Three times a day (TID) | ORAL | 5 refills | Status: AC
  Filled 2021-08-09 – 2021-09-07 (×2): qty 90, 30d supply, fill #0

## 2021-08-15 ENCOUNTER — Other Ambulatory Visit (HOSPITAL_COMMUNITY): Payer: Self-pay

## 2021-08-16 ENCOUNTER — Other Ambulatory Visit (HOSPITAL_COMMUNITY): Payer: Self-pay

## 2021-08-19 ENCOUNTER — Other Ambulatory Visit (HOSPITAL_COMMUNITY): Payer: Self-pay

## 2021-08-19 MED ORDER — AMPHETAMINE-DEXTROAMPHET ER 20 MG PO CP24
20.0000 mg | ORAL_CAPSULE | Freq: Every morning | ORAL | 0 refills | Status: DC
  Filled 2021-08-19: qty 30, 30d supply, fill #0

## 2021-08-19 MED ORDER — LATUDA 40 MG PO TABS
40.0000 mg | ORAL_TABLET | Freq: Every day | ORAL | 0 refills | Status: DC
  Filled 2021-08-19: qty 90, 90d supply, fill #0

## 2021-08-26 ENCOUNTER — Other Ambulatory Visit (HOSPITAL_COMMUNITY): Payer: Self-pay

## 2021-08-27 ENCOUNTER — Other Ambulatory Visit (HOSPITAL_COMMUNITY): Payer: Self-pay

## 2021-08-28 ENCOUNTER — Other Ambulatory Visit (HOSPITAL_COMMUNITY): Payer: Self-pay

## 2021-08-30 ENCOUNTER — Other Ambulatory Visit (HOSPITAL_COMMUNITY): Payer: Self-pay

## 2021-08-31 ENCOUNTER — Other Ambulatory Visit (HOSPITAL_COMMUNITY): Payer: Self-pay

## 2021-08-31 DIAGNOSIS — R030 Elevated blood-pressure reading, without diagnosis of hypertension: Secondary | ICD-10-CM | POA: Diagnosis not present

## 2021-08-31 DIAGNOSIS — Z23 Encounter for immunization: Secondary | ICD-10-CM | POA: Diagnosis not present

## 2021-08-31 DIAGNOSIS — E785 Hyperlipidemia, unspecified: Secondary | ICD-10-CM | POA: Diagnosis not present

## 2021-08-31 DIAGNOSIS — E032 Hypothyroidism due to medicaments and other exogenous substances: Secondary | ICD-10-CM | POA: Diagnosis not present

## 2021-08-31 DIAGNOSIS — R7303 Prediabetes: Secondary | ICD-10-CM | POA: Diagnosis not present

## 2021-08-31 MED ORDER — LEVOTHYROXINE SODIUM 88 MCG PO TABS
88.0000 ug | ORAL_TABLET | Freq: Every morning | ORAL | 1 refills | Status: DC
  Filled 2021-08-31: qty 90, 90d supply, fill #0
  Filled 2021-11-21: qty 90, 90d supply, fill #1

## 2021-09-02 ENCOUNTER — Other Ambulatory Visit (HOSPITAL_COMMUNITY): Payer: Self-pay

## 2021-09-06 ENCOUNTER — Other Ambulatory Visit (INDEPENDENT_AMBULATORY_CARE_PROVIDER_SITE_OTHER): Payer: Self-pay | Admitting: Family Medicine

## 2021-09-06 ENCOUNTER — Other Ambulatory Visit (HOSPITAL_COMMUNITY): Payer: Self-pay

## 2021-09-06 DIAGNOSIS — R7303 Prediabetes: Secondary | ICD-10-CM

## 2021-09-06 DIAGNOSIS — F319 Bipolar disorder, unspecified: Secondary | ICD-10-CM | POA: Diagnosis not present

## 2021-09-06 MED ORDER — METFORMIN HCL 500 MG PO TABS
500.0000 mg | ORAL_TABLET | Freq: Two times a day (BID) | ORAL | 0 refills | Status: DC
  Filled 2021-09-07: qty 60, 30d supply, fill #0

## 2021-09-06 NOTE — Telephone Encounter (Signed)
LAST APPOINTMENT DATE: 07/08/21 NEXT APPOINTMENT DATE: 10/04/21   [facility] [address] - [place], [place] - [address] [place] [address] Phone: [phone] Fax: [place] [address] [place] [address] Phone: [phone] Fax: [phone]  Patient is requesting a refill of the following medications: Requested Prescriptions   Pending Prescriptions Disp Refills   metFORMIN (GLUCOPHAGE) 500 MG tablet 60 tablet 0    Sig: Take 1 tablet (500 mg total) by mouth 2 (two) times daily with a meal.    Date last filled: 07/08/21 Previously prescribed by Dr. Leafy Ro  Lab Results  Component Value Date   HGBA1C 5.1 03/11/2021   HGBA1C 5.1 06/10/2020   HGBA1C 4.8 12/24/2019   Lab Results  Component Value Date   LDLCALC 122 (H) 03/11/2021   CREATININE 0.98 03/11/2021   Lab Results  Component Value Date   VD25OH 46.4 03/11/2021   VD25OH 107.0 (H) 06/10/2020   VD25OH 97.0 12/24/2019    BP Readings from Last 3 Encounters:  07/08/21 113/76  04/21/21 112/73  03/11/21 134/80

## 2021-09-07 ENCOUNTER — Other Ambulatory Visit (HOSPITAL_COMMUNITY): Payer: Self-pay

## 2021-09-08 ENCOUNTER — Other Ambulatory Visit (HOSPITAL_COMMUNITY): Payer: Self-pay

## 2021-09-10 ENCOUNTER — Other Ambulatory Visit (HOSPITAL_COMMUNITY): Payer: Self-pay

## 2021-09-13 ENCOUNTER — Other Ambulatory Visit (HOSPITAL_COMMUNITY): Payer: Self-pay

## 2021-09-14 DIAGNOSIS — F319 Bipolar disorder, unspecified: Secondary | ICD-10-CM | POA: Diagnosis not present

## 2021-09-15 ENCOUNTER — Other Ambulatory Visit (HOSPITAL_COMMUNITY): Payer: Self-pay

## 2021-09-20 ENCOUNTER — Other Ambulatory Visit (HOSPITAL_COMMUNITY): Payer: Self-pay

## 2021-09-20 MED ORDER — DIVALPROEX SODIUM ER 500 MG PO TB24
1000.0000 mg | ORAL_TABLET | Freq: Every day | ORAL | 0 refills | Status: DC
  Filled 2021-09-20: qty 180, 90d supply, fill #0

## 2021-09-20 MED ORDER — AMPHETAMINE-DEXTROAMPHET ER 20 MG PO CP24
20.0000 mg | ORAL_CAPSULE | Freq: Every morning | ORAL | 0 refills | Status: DC
  Filled 2021-09-20: qty 30, 30d supply, fill #0

## 2021-09-22 ENCOUNTER — Other Ambulatory Visit (HOSPITAL_COMMUNITY): Payer: Self-pay

## 2021-09-22 DIAGNOSIS — F319 Bipolar disorder, unspecified: Secondary | ICD-10-CM | POA: Diagnosis not present

## 2021-09-22 MED ORDER — GABAPENTIN 300 MG PO CAPS
600.0000 mg | ORAL_CAPSULE | Freq: Three times a day (TID) | ORAL | 3 refills | Status: AC
  Filled 2021-09-22 – 2021-09-23 (×2): qty 180, 30d supply, fill #0
  Filled 2021-10-24: qty 180, 30d supply, fill #1
  Filled 2021-11-21: qty 180, 30d supply, fill #2
  Filled 2021-12-23: qty 180, 30d supply, fill #3

## 2021-09-22 MED ORDER — GABAPENTIN 300 MG PO CAPS
300.0000 mg | ORAL_CAPSULE | Freq: Two times a day (BID) | ORAL | 2 refills | Status: DC
  Filled 2021-09-22: qty 60, 30d supply, fill #0

## 2021-09-23 ENCOUNTER — Other Ambulatory Visit (HOSPITAL_COMMUNITY): Payer: Self-pay

## 2021-09-23 ENCOUNTER — Other Ambulatory Visit (INDEPENDENT_AMBULATORY_CARE_PROVIDER_SITE_OTHER): Payer: Self-pay | Admitting: Family Medicine

## 2021-09-23 DIAGNOSIS — R7303 Prediabetes: Secondary | ICD-10-CM

## 2021-09-24 ENCOUNTER — Other Ambulatory Visit (INDEPENDENT_AMBULATORY_CARE_PROVIDER_SITE_OTHER): Payer: Self-pay | Admitting: Family Medicine

## 2021-09-24 ENCOUNTER — Other Ambulatory Visit (HOSPITAL_COMMUNITY): Payer: Self-pay

## 2021-09-24 DIAGNOSIS — R7303 Prediabetes: Secondary | ICD-10-CM

## 2021-09-25 ENCOUNTER — Other Ambulatory Visit (HOSPITAL_COMMUNITY): Payer: Self-pay

## 2021-09-27 ENCOUNTER — Other Ambulatory Visit (INDEPENDENT_AMBULATORY_CARE_PROVIDER_SITE_OTHER): Payer: Self-pay | Admitting: Family Medicine

## 2021-09-27 ENCOUNTER — Other Ambulatory Visit (HOSPITAL_COMMUNITY): Payer: Self-pay

## 2021-09-27 DIAGNOSIS — R7303 Prediabetes: Secondary | ICD-10-CM

## 2021-09-27 NOTE — Telephone Encounter (Signed)
LAST APPOINTMENT DATE: 07/08/21 NEXT APPOINTMENT DATE: 10/04/21   [facility] - [place], [place] - [address] [place] [address] Phone: [phone] Fax: [place] [address] [place] [address] Phone: [phone] Fax: [phone]  Patient is requesting a refill of the following medications: Requested Prescriptions   Pending Prescriptions Disp Refills   tirzepatide (MOUNJARO) 15 MG/0.5ML Pen 6 mL 0    Sig: Inject 15 mg into the skin once a week.    Date last filled: 07/08/2021 Previously prescribed by Dr. Leafy Ro  Lab Results  Component Value Date   HGBA1C 5.1 03/11/2021   HGBA1C 5.1 06/10/2020   HGBA1C 4.8 12/24/2019   Lab Results  Component Value Date   LDLCALC 122 (H) 03/11/2021   CREATININE 0.98 03/11/2021   Lab Results  Component Value Date   VD25OH 46.4 03/11/2021   VD25OH 107.0 (H) 06/10/2020   VD25OH 97.0 12/24/2019    BP Readings from Last 3 Encounters:  07/08/21 113/76  04/21/21 112/73  03/11/21 134/80

## 2021-09-28 ENCOUNTER — Other Ambulatory Visit (HOSPITAL_COMMUNITY): Payer: Self-pay

## 2021-09-28 MED ORDER — MOUNJARO 15 MG/0.5ML ~~LOC~~ SOAJ
15.0000 mg | SUBCUTANEOUS | 0 refills | Status: DC
Start: 2021-09-28 — End: 2021-11-10
  Filled 2021-09-28: qty 6, 84d supply, fill #0

## 2021-09-28 NOTE — Telephone Encounter (Signed)
Dr.Beasley 

## 2021-09-28 NOTE — Telephone Encounter (Signed)
LAST APPOINTMENT DATE: 07/08/21 NEXT APPOINTMENT DATE: 10/04/21   [facility] - [place], [place] - [address] [place] [address] Phone: [phone] Fax: [place] [address] [place] [address] Phone: [phone] Fax: [phone]  Patient is requesting a refill of the following medications: Requested Prescriptions   Pending Prescriptions Disp Refills   tirzepatide (MOUNJARO) 15 MG/0.5ML Pen 6 mL 0    Sig: Inject 15 mg into the skin once a week.    Date last filled: 07/08/21 Previously prescribed by Dr. Leafy Ro  Lab Results  Component Value Date   HGBA1C 5.1 03/11/2021   HGBA1C 5.1 06/10/2020   HGBA1C 4.8 12/24/2019   Lab Results  Component Value Date   LDLCALC 122 (H) 03/11/2021   CREATININE 0.98 03/11/2021   Lab Results  Component Value Date   VD25OH 46.4 03/11/2021   VD25OH 107.0 (H) 06/10/2020   VD25OH 97.0 12/24/2019    BP Readings from Last 3 Encounters:  07/08/21 113/76  04/21/21 112/73  03/11/21 134/80

## 2021-09-29 ENCOUNTER — Other Ambulatory Visit (HOSPITAL_COMMUNITY): Payer: Self-pay

## 2021-09-30 ENCOUNTER — Other Ambulatory Visit (HOSPITAL_COMMUNITY): Payer: Self-pay

## 2021-09-30 DIAGNOSIS — F319 Bipolar disorder, unspecified: Secondary | ICD-10-CM | POA: Diagnosis not present

## 2021-09-30 MED ORDER — TRAZODONE HCL 100 MG PO TABS
50.0000 mg | ORAL_TABLET | Freq: Every day | ORAL | 0 refills | Status: DC
Start: 2021-09-30 — End: 2022-01-26
  Filled 2021-09-30: qty 90, 90d supply, fill #0

## 2021-09-30 MED ORDER — VENLAFAXINE HCL ER 75 MG PO CP24
75.0000 mg | ORAL_CAPSULE | Freq: Every morning | ORAL | 0 refills | Status: DC
  Filled 2021-09-30: qty 90, 90d supply, fill #0

## 2021-09-30 MED ORDER — MIRTAZAPINE 15 MG PO TABS
15.0000 mg | ORAL_TABLET | Freq: Every day | ORAL | 2 refills | Status: DC
  Filled 2021-09-30: qty 30, 30d supply, fill #0
  Filled 2021-10-28: qty 30, 30d supply, fill #1
  Filled 2021-11-26: qty 30, 30d supply, fill #2

## 2021-10-04 ENCOUNTER — Ambulatory Visit (INDEPENDENT_AMBULATORY_CARE_PROVIDER_SITE_OTHER): Payer: 59 | Admitting: Family Medicine

## 2021-10-05 ENCOUNTER — Other Ambulatory Visit (HOSPITAL_COMMUNITY): Payer: Self-pay

## 2021-10-05 MED ORDER — VIBERZI 100 MG PO TABS
1.0000 | ORAL_TABLET | Freq: Two times a day (BID) | ORAL | 0 refills | Status: AC
  Filled 2021-10-05: qty 180, 90d supply, fill #0

## 2021-10-14 ENCOUNTER — Other Ambulatory Visit (INDEPENDENT_AMBULATORY_CARE_PROVIDER_SITE_OTHER): Payer: Self-pay | Admitting: Family Medicine

## 2021-10-14 ENCOUNTER — Other Ambulatory Visit (HOSPITAL_COMMUNITY): Payer: Self-pay

## 2021-10-14 DIAGNOSIS — R7303 Prediabetes: Secondary | ICD-10-CM

## 2021-10-14 DIAGNOSIS — F3289 Other specified depressive episodes: Secondary | ICD-10-CM

## 2021-10-14 NOTE — Telephone Encounter (Signed)
LAST APPOINTMENT DATE: 07/08/21 NEXT APPOINTMENT DATE: 11/02/21   [facility] - [place], [place] - [address] [place] [address] Phone: [phone] Fax: [place] [address] [place] [address] Phone: [phone] Fax: [phone]  Patient is requesting a refill of the following medications: Requested Prescriptions   Pending Prescriptions Disp Refills   topiramate (TOPAMAX) 50 MG tablet 30 tablet 0    Sig: Take 1 tablet (50 mg total) by mouth daily.   metFORMIN (GLUCOPHAGE) 500 MG tablet 60 tablet 0    Sig: Take 1 tablet (500 mg total) by mouth 2 (two) times daily with a meal.    Date last filled: 09/06/21 Previously prescribed by Dr. Leafy Ro  Lab Results  Component Value Date   HGBA1C 5.1 03/11/2021   HGBA1C 5.1 06/10/2020   HGBA1C 4.8 12/24/2019   Lab Results  Component Value Date   LDLCALC 122 (H) 03/11/2021   CREATININE 0.98 03/11/2021   Lab Results  Component Value Date   VD25OH 46.4 03/11/2021   VD25OH 107.0 (H) 06/10/2020   VD25OH 97.0 12/24/2019    BP Readings from Last 3 Encounters:  07/08/21 113/76  04/21/21 112/73  03/11/21 134/80

## 2021-10-15 ENCOUNTER — Other Ambulatory Visit (INDEPENDENT_AMBULATORY_CARE_PROVIDER_SITE_OTHER): Payer: Self-pay | Admitting: Family Medicine

## 2021-10-15 ENCOUNTER — Other Ambulatory Visit (HOSPITAL_COMMUNITY): Payer: Self-pay

## 2021-10-15 DIAGNOSIS — F3289 Other specified depressive episodes: Secondary | ICD-10-CM

## 2021-10-15 DIAGNOSIS — R7303 Prediabetes: Secondary | ICD-10-CM

## 2021-10-18 ENCOUNTER — Other Ambulatory Visit (HOSPITAL_COMMUNITY): Payer: Self-pay

## 2021-10-18 ENCOUNTER — Telehealth (INDEPENDENT_AMBULATORY_CARE_PROVIDER_SITE_OTHER): Payer: Self-pay | Admitting: Family Medicine

## 2021-10-18 NOTE — Telephone Encounter (Signed)
Patient states that he needs MetFORMIN (GLUCOPHAGE) 500 MG tablet and Topiramate. Patient completely out of both. His pharmacy has denied refills. Patient would like a call back.

## 2021-10-19 NOTE — Telephone Encounter (Signed)
Spoke w/ pt, aware that we will refill at next visit, offered earlier time, but needs to keep current appt-CAS

## 2021-10-19 NOTE — Telephone Encounter (Signed)
LAST APPOINTMENT DATE: 07/08/21 NEXT APPOINTMENT DATE: 11/02/21   [facility] - [place], [place] - [address] [place] [address] Phone: [phone] Fax: [place] [address] [place] [address] Phone: [phone] Fax: [phone]  Patient is requesting a refill of the following medications: No prescriptions requested or ordered in this encounter   Date last filled: 07/08/21 for topiramate for other depression 09/06/21 for metformin Previously prescribed by Dr Leafy Ro  Lab Results      Component                Value               Date                      HGBA1C                   5.1                 03/11/2021                HGBA1C                   5.1                 06/10/2020                HGBA1C                   4.8                 12/24/2019           Lab Results      Component                Value               Date                      LDLCALC                  122 (H)             03/11/2021                CREATININE               0.98                03/11/2021           Lab Results      Component                Value               Date                      VD25OH                   46.4                03/11/2021                VD25OH                   107.0 (H)           06/10/2020  VD25OH                   97.0                12/24/2019            BP Readings from Last 3 Encounters: 07/08/21 : 113/76 04/21/21 : 112/73 03/11/21 : 134/80

## 2021-10-24 ENCOUNTER — Other Ambulatory Visit (HOSPITAL_COMMUNITY): Payer: Self-pay

## 2021-10-25 ENCOUNTER — Other Ambulatory Visit (HOSPITAL_COMMUNITY): Payer: Self-pay

## 2021-10-25 MED ORDER — OMEPRAZOLE 40 MG PO CPDR
40.0000 mg | DELAYED_RELEASE_CAPSULE | Freq: Every day | ORAL | 3 refills | Status: DC
  Filled 2021-10-25: qty 90, 90d supply, fill #0
  Filled 2022-01-20: qty 90, 90d supply, fill #1
  Filled 2022-03-30: qty 90, 90d supply, fill #2
  Filled 2022-07-07: qty 90, 90d supply, fill #3

## 2021-10-25 MED ORDER — AMPHETAMINE-DEXTROAMPHET ER 20 MG PO CP24
20.0000 mg | ORAL_CAPSULE | Freq: Every morning | ORAL | 0 refills | Status: DC
  Filled 2021-10-25: qty 30, 30d supply, fill #0

## 2021-10-28 ENCOUNTER — Other Ambulatory Visit (HOSPITAL_COMMUNITY): Payer: Self-pay

## 2021-10-28 MED ORDER — SILDENAFIL CITRATE 100 MG PO TABS
100.0000 mg | ORAL_TABLET | Freq: Every day | ORAL | 6 refills | Status: AC | PRN
  Filled 2021-10-28: qty 6, 30d supply, fill #0
  Filled 2021-11-21: qty 6, 30d supply, fill #1
  Filled 2021-12-23: qty 6, 30d supply, fill #2
  Filled 2022-01-20: qty 6, 30d supply, fill #3
  Filled 2022-02-18: qty 6, 30d supply, fill #4
  Filled 2022-03-30: qty 6, 30d supply, fill #5

## 2021-10-28 MED ORDER — ATORVASTATIN CALCIUM 40 MG PO TABS
40.0000 mg | ORAL_TABLET | Freq: Every day | ORAL | 0 refills | Status: DC
Start: 2021-10-28 — End: 2022-02-07
  Filled 2021-10-28: qty 90, 90d supply, fill #0

## 2021-10-29 ENCOUNTER — Other Ambulatory Visit (HOSPITAL_COMMUNITY): Payer: Self-pay

## 2021-11-02 ENCOUNTER — Ambulatory Visit (INDEPENDENT_AMBULATORY_CARE_PROVIDER_SITE_OTHER): Payer: 59 | Admitting: Family Medicine

## 2021-11-02 ENCOUNTER — Other Ambulatory Visit (HOSPITAL_COMMUNITY): Payer: Self-pay

## 2021-11-02 ENCOUNTER — Ambulatory Visit (INDEPENDENT_AMBULATORY_CARE_PROVIDER_SITE_OTHER): Payer: 59 | Admitting: Adult Health

## 2021-11-03 ENCOUNTER — Other Ambulatory Visit (HOSPITAL_COMMUNITY): Payer: Self-pay

## 2021-11-04 ENCOUNTER — Other Ambulatory Visit (HOSPITAL_COMMUNITY): Payer: Self-pay

## 2021-11-05 ENCOUNTER — Other Ambulatory Visit (HOSPITAL_COMMUNITY): Payer: Self-pay

## 2021-11-08 ENCOUNTER — Other Ambulatory Visit (INDEPENDENT_AMBULATORY_CARE_PROVIDER_SITE_OTHER): Payer: Self-pay | Admitting: Family Medicine

## 2021-11-08 ENCOUNTER — Other Ambulatory Visit (HOSPITAL_COMMUNITY): Payer: Self-pay

## 2021-11-08 DIAGNOSIS — R7303 Prediabetes: Secondary | ICD-10-CM

## 2021-11-08 DIAGNOSIS — F3289 Other specified depressive episodes: Secondary | ICD-10-CM

## 2021-11-08 NOTE — Telephone Encounter (Signed)
Dr.Beasley 

## 2021-11-09 ENCOUNTER — Encounter (INDEPENDENT_AMBULATORY_CARE_PROVIDER_SITE_OTHER): Payer: Self-pay | Admitting: Family Medicine

## 2021-11-09 NOTE — Telephone Encounter (Signed)
Dr.Beasley 

## 2021-11-10 ENCOUNTER — Other Ambulatory Visit (INDEPENDENT_AMBULATORY_CARE_PROVIDER_SITE_OTHER): Payer: Self-pay

## 2021-11-10 ENCOUNTER — Other Ambulatory Visit (HOSPITAL_COMMUNITY): Payer: Self-pay

## 2021-11-10 DIAGNOSIS — R7303 Prediabetes: Secondary | ICD-10-CM

## 2021-11-10 DIAGNOSIS — F3289 Other specified depressive episodes: Secondary | ICD-10-CM

## 2021-11-10 MED ORDER — MOUNJARO 15 MG/0.5ML ~~LOC~~ SOAJ
15.0000 mg | SUBCUTANEOUS | 0 refills | Status: DC
  Filled 2021-11-10: qty 6, 84d supply, fill #0

## 2021-11-10 MED ORDER — METFORMIN HCL 500 MG PO TABS
500.0000 mg | ORAL_TABLET | Freq: Two times a day (BID) | ORAL | 0 refills | Status: DC
  Filled 2021-11-10: qty 60, 30d supply, fill #0

## 2021-11-10 MED ORDER — TOPIRAMATE 50 MG PO TABS
50.0000 mg | ORAL_TABLET | Freq: Every day | ORAL | 0 refills | Status: DC
  Filled 2021-11-10: qty 30, 30d supply, fill #0

## 2021-11-10 NOTE — Telephone Encounter (Signed)
Yes, please refill all

## 2021-11-10 NOTE — Telephone Encounter (Signed)
Please see message and advise.  Thank you. ?Ok to fill all?

## 2021-11-10 NOTE — Telephone Encounter (Signed)
Medication sent to pharmacy for all 3 patients. ?

## 2021-11-16 ENCOUNTER — Other Ambulatory Visit (HOSPITAL_COMMUNITY): Payer: Self-pay

## 2021-11-16 DIAGNOSIS — G4700 Insomnia, unspecified: Secondary | ICD-10-CM | POA: Diagnosis not present

## 2021-11-16 DIAGNOSIS — F902 Attention-deficit hyperactivity disorder, combined type: Secondary | ICD-10-CM | POA: Diagnosis not present

## 2021-11-16 DIAGNOSIS — F319 Bipolar disorder, unspecified: Secondary | ICD-10-CM | POA: Diagnosis not present

## 2021-11-16 MED ORDER — BUSPIRONE HCL 5 MG PO TABS
5.0000 mg | ORAL_TABLET | Freq: Two times a day (BID) | ORAL | 0 refills | Status: AC
Start: 2021-11-16 — End: ?
  Filled 2021-11-16: qty 60, 30d supply, fill #0

## 2021-11-16 MED ORDER — AMPHETAMINE-DEXTROAMPHET ER 20 MG PO CP24
20.0000 mg | ORAL_CAPSULE | Freq: Every morning | ORAL | 0 refills | Status: AC
  Filled 2021-12-01: qty 30, 30d supply, fill #0

## 2021-11-16 MED ORDER — LURASIDONE HCL 40 MG PO TABS
40.0000 mg | ORAL_TABLET | Freq: Every day | ORAL | 0 refills | Status: DC
  Filled 2021-11-16: qty 90, 90d supply, fill #0

## 2021-11-22 ENCOUNTER — Other Ambulatory Visit (HOSPITAL_COMMUNITY): Payer: Self-pay

## 2021-11-26 ENCOUNTER — Other Ambulatory Visit (HOSPITAL_COMMUNITY): Payer: Self-pay

## 2021-11-26 DIAGNOSIS — F319 Bipolar disorder, unspecified: Secondary | ICD-10-CM | POA: Diagnosis not present

## 2021-11-29 ENCOUNTER — Other Ambulatory Visit (HOSPITAL_COMMUNITY): Payer: Self-pay

## 2021-11-29 MED ORDER — LEVOTHYROXINE SODIUM 88 MCG PO TABS
88.0000 ug | ORAL_TABLET | Freq: Every morning | ORAL | 1 refills | Status: DC
  Filled 2021-11-29 – 2022-02-12 (×2): qty 90, 90d supply, fill #0
  Filled 2022-04-18 – 2022-05-30 (×2): qty 90, 90d supply, fill #1

## 2021-11-30 DIAGNOSIS — R7303 Prediabetes: Secondary | ICD-10-CM | POA: Diagnosis not present

## 2021-11-30 DIAGNOSIS — Z130 Encounter for screening for diseases of the blood and blood-forming organs and certain disorders involving the immune mechanism: Secondary | ICD-10-CM | POA: Diagnosis not present

## 2021-11-30 DIAGNOSIS — E785 Hyperlipidemia, unspecified: Secondary | ICD-10-CM | POA: Diagnosis not present

## 2021-11-30 DIAGNOSIS — F319 Bipolar disorder, unspecified: Secondary | ICD-10-CM | POA: Diagnosis not present

## 2021-11-30 DIAGNOSIS — Z0000 Encounter for general adult medical examination without abnormal findings: Secondary | ICD-10-CM | POA: Diagnosis not present

## 2021-11-30 DIAGNOSIS — E032 Hypothyroidism due to medicaments and other exogenous substances: Secondary | ICD-10-CM | POA: Diagnosis not present

## 2021-11-30 DIAGNOSIS — Z125 Encounter for screening for malignant neoplasm of prostate: Secondary | ICD-10-CM | POA: Diagnosis not present

## 2021-12-01 ENCOUNTER — Other Ambulatory Visit (HOSPITAL_COMMUNITY): Payer: Self-pay

## 2021-12-01 MED ORDER — AMPHETAMINE-DEXTROAMPHET ER 20 MG PO CP24
20.0000 mg | ORAL_CAPSULE | Freq: Every morning | ORAL | 0 refills | Status: DC
  Filled 2021-12-01: qty 30, 30d supply, fill #0

## 2021-12-06 ENCOUNTER — Other Ambulatory Visit (HOSPITAL_COMMUNITY): Payer: Self-pay

## 2021-12-12 ENCOUNTER — Other Ambulatory Visit (HOSPITAL_COMMUNITY): Payer: Self-pay

## 2021-12-13 ENCOUNTER — Other Ambulatory Visit (HOSPITAL_COMMUNITY): Payer: Self-pay

## 2021-12-13 MED ORDER — DIVALPROEX SODIUM ER 500 MG PO TB24
1000.0000 mg | ORAL_TABLET | Freq: Every day | ORAL | 0 refills | Status: DC
Start: 2021-12-13 — End: 2022-02-21
  Filled 2021-12-13: qty 180, 90d supply, fill #0

## 2021-12-15 ENCOUNTER — Other Ambulatory Visit (HOSPITAL_COMMUNITY): Payer: Self-pay

## 2021-12-15 MED ORDER — TRAZODONE HCL 100 MG PO TABS
50.0000 mg | ORAL_TABLET | Freq: Every evening | ORAL | 0 refills | Status: DC | PRN
  Filled 2021-12-15: qty 90, 90d supply, fill #0

## 2021-12-16 ENCOUNTER — Other Ambulatory Visit (HOSPITAL_COMMUNITY): Payer: Self-pay

## 2021-12-23 ENCOUNTER — Other Ambulatory Visit (HOSPITAL_COMMUNITY): Payer: Self-pay

## 2021-12-23 MED ORDER — AMPHETAMINE-DEXTROAMPHET ER 20 MG PO CP24
20.0000 mg | ORAL_CAPSULE | Freq: Every morning | ORAL | 0 refills | Status: AC
  Filled 2022-01-14: qty 30, 30d supply, fill #0

## 2021-12-23 MED ORDER — VENLAFAXINE HCL ER 75 MG PO CP24
75.0000 mg | ORAL_CAPSULE | Freq: Every morning | ORAL | 0 refills | Status: DC
  Filled 2021-12-23: qty 90, 90d supply, fill #0

## 2021-12-23 MED ORDER — MIRTAZAPINE 15 MG PO TABS
15.0000 mg | ORAL_TABLET | Freq: Every day | ORAL | 2 refills | Status: AC
  Filled 2021-12-23: qty 30, 30d supply, fill #0
  Filled 2022-01-20: qty 30, 30d supply, fill #1

## 2021-12-26 NOTE — Progress Notes (Signed)
?TeleHealth Visit:  ?This visit was completed with telemedicine (audio/video) technology. ?Brian Cunningham has verbally consented to this TeleHealth visit. The patient is located at home, the provider is located at home. The participants in this visit include the listed provider and patient. The visit was conducted today via MyChart video. ? ?OBESITY ?Brian Cunningham is here to discuss his progress with his obesity treatment plan along with follow-up of his obesity related diagnoses.  ? ?Today's visit was # 26 ?Starting weight: 262 lbs ?Starting date: 03/12/2019 ?Weight at last in office visit: 234 lbs on 07/08/21 ?Total weight loss: 28 lbs at last in office visit on 07/08/21. ?Today's reported weight: 245 lbs  ? ?Nutrition Plan: the Category 4 Plan 40% of the time. ?Hunger is moderately controlled. Cravings are moderately controlled.  ?Current exercise:  walking at work. ? ?Interim History: Brian Cunningham lives in [place] which makes it hard for him to follow-up at the clinic often.  His last office visit was July 08, 2021 at which time he weighed 234 pounds. He reports a weight of 245 pounds which is up 11 pounds from his last office visit. ?He has had to work some night shifts recently which has made it more difficult for him to adhere to the plan.  He plans on starting to pack his lunch this week. ?He is on Saxenda 3.0 mg daily. Still notes some hunger, especially after working a night shift.  ?He feels that alternating in person visits with virtual visits will work well for him, his wife, and his son who are also patients at our clinic. ? ?Assessment/Plan:  ?1. Prediabetes ?Brian Cunningham has a diagnosis of prediabetes based on his elevated HgA1c. ?He admits to polyphagia. ?Medication(s): Metformin 500 mg twice daily, Saxenda 3 mg daily ?Lab Results  ?Component Value Date  ? HGBA1C 5.1 03/11/2021  ? ?Lab Results  ?Component Value Date  ? INSULIN 13.8 03/11/2021  ? INSULIN 9.6 06/10/2020  ? INSULIN  13.0 12/24/2019  ? INSULIN 18.1 08/13/2019  ? INSULIN 31.1 (H) 03/12/2019  ? ? ?Plan: ?Refill metformin 500 mg twice daily. ?Discontinue Saxenda ?New Rx for Wegovy 1.7 mg daily. ? ?2.  Other depression with emotional eating  ?He feels that his cravings are pretty well controlled with Topamax 50 mg once daily.  Denies side effects. ? ?Plan: ?Refill Topamax 50 mg nightly ? ?3. Obesity: Current BMI 30.03 ?Brian Cunningham is currently in the action stage of change. As such, his goal is to continue with weight loss efforts.  ?He has agreed to the Category 4 Plan.  ? ?Exercise goals: All adults should avoid inactivity. Some physical activity is better than none, and adults who participate in any amount of physical activity gain some health benefits. ? ?Behavioral modification strategies: increasing lean protein intake and decreasing simple carbohydrates. ? ?Brian Cunningham has agreed to follow-up with our clinic in 4 weeks.  ? ?No orders of the defined types were placed in this encounter. ? ? ?Medications Discontinued During This Encounter  ?Medication Reason  ? tirzepatide [facility]) 15 QQ/7.6PP Pen Duplicate  ? metFORMIN (GLUCOPHAGE) 500 MG tablet Reorder  ? topiramate (TOPAMAX) 50 MG tablet Reorder  ?  ? ?Meds ordered this encounter  ?Medications  ? Semaglutide-Weight Management (WEGOVY) 1.7 MG/0.75ML SOAJ  ?  Sig: Inject 1.7 mg into the skin once a week.  ?  Dispense:  3 mL  ?  Refill:  0  ?  Order Specific Question:   Supervising Provider  ?  Answer:   Dennard Nip  D [[id number]]  ? metFORMIN (GLUCOPHAGE) 500 MG tablet  ?  Sig: Take 1 tablet (500 mg total) by mouth 2 (two) times daily with a meal.  ?  Dispense:  60 tablet  ?  Refill:  0  ?  Order Specific Question:   Supervising Provider  ?  Answer:   Dennard Nip D [[id number]]  ? topiramate (TOPAMAX) 50 MG tablet  ?  Sig: Take 1 tablet (50 mg total) by mouth daily.  ?  Dispense:  30 tablet  ?  Refill:  0  ?  Order Specific Question:   Supervising Provider  ?  Answer:    Dennard Nip D [[id number]]  ?   ? ?Objective:  ? ?VITALS: Per patient if applicable, see vitals. ?GENERAL: Alert and in no acute distress. ?CARDIOPULMONARY: No increased WOB. Speaking in clear sentences.  ?PSYCH: Pleasant and cooperative. Speech normal rate and rhythm. Affect is appropriate. Insight and judgement are appropriate. Attention is focused, linear, and appropriate.  ?NEURO: Oriented as arrived to appointment on time with no prompting.  ? ?Lab Results  ?Component Value Date  ? CREATININE 0.98 03/11/2021  ? BUN 20 03/11/2021  ? NA 141 03/11/2021  ? K 4.3 03/11/2021  ? CL 101 03/11/2021  ? CO2 25 03/11/2021  ? ?Lab Results  ?Component Value Date  ? ALT 34 03/11/2021  ? AST 25 03/11/2021  ? ALKPHOS 66 03/11/2021  ? BILITOT 0.7 03/11/2021  ? ?Lab Results  ?Component Value Date  ? HGBA1C 5.1 03/11/2021  ? HGBA1C 5.1 06/10/2020  ? HGBA1C 4.8 12/24/2019  ? HGBA1C 4.8 08/13/2019  ? HGBA1C 5.2 03/12/2019  ? ?Lab Results  ?Component Value Date  ? INSULIN 13.8 03/11/2021  ? INSULIN 9.6 06/10/2020  ? INSULIN 13.0 12/24/2019  ? INSULIN 18.1 08/13/2019  ? INSULIN 31.1 (H) 03/12/2019  ? ?Lab Results  ?Component Value Date  ? TSH 1.890 03/11/2021  ? ?Lab Results  ?Component Value Date  ? CHOL 191 03/11/2021  ? HDL 50 03/11/2021  ? LDLCALC 122 (H) 03/11/2021  ? TRIG 106 03/11/2021  ? ?Lab Results  ?Component Value Date  ? WBC 5.4 03/12/2019  ? HGB 16.9 03/12/2019  ? HCT 50.1 03/12/2019  ? MCV 101 (H) 03/12/2019  ? ?No results found for: IRON, TIBC, FERRITIN ?Lab Results  ?Component Value Date  ? VD25OH 46.4 03/11/2021  ? VD25OH 107.0 (H) 06/10/2020  ? VD25OH 97.0 12/24/2019  ? ? ?Attestation Statements:  ? ?Reviewed by clinician on day of visit: allergies, medications, problem list, medical history, surgical history, family history, social history, and previous encounter notes. ? ? ? ?

## 2021-12-27 ENCOUNTER — Encounter (INDEPENDENT_AMBULATORY_CARE_PROVIDER_SITE_OTHER): Payer: Self-pay | Admitting: Family Medicine

## 2021-12-27 ENCOUNTER — Telehealth (INDEPENDENT_AMBULATORY_CARE_PROVIDER_SITE_OTHER): Payer: 59 | Admitting: Family Medicine

## 2021-12-27 ENCOUNTER — Other Ambulatory Visit (HOSPITAL_COMMUNITY): Payer: Self-pay

## 2021-12-27 DIAGNOSIS — E669 Obesity, unspecified: Secondary | ICD-10-CM

## 2021-12-27 DIAGNOSIS — F3289 Other specified depressive episodes: Secondary | ICD-10-CM

## 2021-12-27 DIAGNOSIS — Z6833 Body mass index (BMI) 33.0-33.9, adult: Secondary | ICD-10-CM

## 2021-12-27 DIAGNOSIS — R7303 Prediabetes: Secondary | ICD-10-CM | POA: Diagnosis not present

## 2021-12-27 DIAGNOSIS — G4700 Insomnia, unspecified: Secondary | ICD-10-CM | POA: Diagnosis not present

## 2021-12-27 DIAGNOSIS — F319 Bipolar disorder, unspecified: Secondary | ICD-10-CM | POA: Diagnosis not present

## 2021-12-27 DIAGNOSIS — F902 Attention-deficit hyperactivity disorder, combined type: Secondary | ICD-10-CM | POA: Diagnosis not present

## 2021-12-27 MED ORDER — VENLAFAXINE HCL ER 37.5 MG PO CP24
37.5000 mg | ORAL_CAPSULE | Freq: Every day | ORAL | 0 refills | Status: DC
Start: 2021-12-27 — End: 2022-01-26
  Filled 2021-12-27: qty 30, 30d supply, fill #0

## 2021-12-27 MED ORDER — METFORMIN HCL 500 MG PO TABS
500.0000 mg | ORAL_TABLET | Freq: Two times a day (BID) | ORAL | 0 refills | Status: DC
  Filled 2021-12-27: qty 60, 30d supply, fill #0

## 2021-12-27 MED ORDER — TOPIRAMATE 50 MG PO TABS
50.0000 mg | ORAL_TABLET | Freq: Every day | ORAL | 0 refills | Status: DC
  Filled 2021-12-27: qty 30, 30d supply, fill #0

## 2021-12-27 MED ORDER — WEGOVY 1.7 MG/0.75ML ~~LOC~~ SOAJ
1.7000 mg | SUBCUTANEOUS | 0 refills | Status: DC
  Filled 2021-12-27 – 2022-01-14 (×2): qty 3, 28d supply, fill #0

## 2021-12-28 ENCOUNTER — Encounter (INDEPENDENT_AMBULATORY_CARE_PROVIDER_SITE_OTHER): Payer: Self-pay

## 2021-12-28 ENCOUNTER — Telehealth (INDEPENDENT_AMBULATORY_CARE_PROVIDER_SITE_OTHER): Payer: Self-pay | Admitting: Family Medicine

## 2021-12-28 NOTE — Telephone Encounter (Signed)
Dawn [employer] - Prior authorization approved for [employer]. Effective: 12/28/2021 - 07/26/2022. Patient sent approval message via mychart.  ?

## 2021-12-28 NOTE — Telephone Encounter (Signed)
fyi

## 2022-01-04 DIAGNOSIS — L989 Disorder of the skin and subcutaneous tissue, unspecified: Secondary | ICD-10-CM | POA: Diagnosis not present

## 2022-01-12 DIAGNOSIS — D485 Neoplasm of uncertain behavior of skin: Secondary | ICD-10-CM | POA: Diagnosis not present

## 2022-01-14 ENCOUNTER — Other Ambulatory Visit (HOSPITAL_COMMUNITY): Payer: Self-pay

## 2022-01-17 ENCOUNTER — Other Ambulatory Visit (HOSPITAL_COMMUNITY): Payer: Self-pay

## 2022-01-17 DIAGNOSIS — F319 Bipolar disorder, unspecified: Secondary | ICD-10-CM | POA: Diagnosis not present

## 2022-01-19 ENCOUNTER — Other Ambulatory Visit (HOSPITAL_COMMUNITY): Payer: Self-pay

## 2022-01-19 MED ORDER — VIBERZI 100 MG PO TABS
100.0000 mg | ORAL_TABLET | Freq: Two times a day (BID) | ORAL | 0 refills | Status: AC
  Filled 2022-01-19: qty 180, 90d supply, fill #0

## 2022-01-20 ENCOUNTER — Other Ambulatory Visit (HOSPITAL_COMMUNITY): Payer: Self-pay

## 2022-01-20 MED ORDER — MIRTAZAPINE 15 MG PO TABS
15.0000 mg | ORAL_TABLET | Freq: Every evening | ORAL | 2 refills | Status: DC
  Filled 2022-01-20: qty 30, 30d supply, fill #0

## 2022-01-26 ENCOUNTER — Telehealth (INDEPENDENT_AMBULATORY_CARE_PROVIDER_SITE_OTHER): Payer: 59 | Admitting: Family Medicine

## 2022-01-26 ENCOUNTER — Other Ambulatory Visit (HOSPITAL_COMMUNITY): Payer: Self-pay

## 2022-01-26 ENCOUNTER — Encounter (INDEPENDENT_AMBULATORY_CARE_PROVIDER_SITE_OTHER): Payer: Self-pay | Admitting: Family Medicine

## 2022-01-26 DIAGNOSIS — Z6830 Body mass index (BMI) 30.0-30.9, adult: Secondary | ICD-10-CM

## 2022-01-26 DIAGNOSIS — R7303 Prediabetes: Secondary | ICD-10-CM

## 2022-01-26 DIAGNOSIS — F3289 Other specified depressive episodes: Secondary | ICD-10-CM | POA: Diagnosis not present

## 2022-01-26 DIAGNOSIS — E669 Obesity, unspecified: Secondary | ICD-10-CM | POA: Diagnosis not present

## 2022-01-26 DIAGNOSIS — Z7984 Long term (current) use of oral hypoglycemic drugs: Secondary | ICD-10-CM

## 2022-01-26 DIAGNOSIS — G4700 Insomnia, unspecified: Secondary | ICD-10-CM | POA: Diagnosis not present

## 2022-01-26 DIAGNOSIS — F319 Bipolar disorder, unspecified: Secondary | ICD-10-CM | POA: Diagnosis not present

## 2022-01-26 DIAGNOSIS — F902 Attention-deficit hyperactivity disorder, combined type: Secondary | ICD-10-CM | POA: Diagnosis not present

## 2022-01-26 MED ORDER — LURASIDONE HCL 40 MG PO TABS
40.0000 mg | ORAL_TABLET | Freq: Every day | ORAL | 0 refills | Status: DC
  Filled 2022-01-26 – 2022-02-12 (×2): qty 90, 90d supply, fill #0

## 2022-01-26 MED ORDER — VENLAFAXINE HCL ER 37.5 MG PO CP24
37.5000 mg | ORAL_CAPSULE | Freq: Every day | ORAL | 0 refills | Status: DC
  Filled 2022-01-26: qty 30, 30d supply, fill #0

## 2022-01-26 MED ORDER — AMPHETAMINE-DEXTROAMPHET ER 20 MG PO CP24: 20.0000 mg | ORAL_CAPSULE | Freq: Every morning | ORAL | 0 refills | Status: DC

## 2022-01-26 MED ORDER — CLONAZEPAM 0.5 MG PO TABS
0.2500 mg | ORAL_TABLET | Freq: Every day | ORAL | 0 refills | Status: DC
  Filled 2022-01-26: qty 30, 30d supply, fill #0

## 2022-01-27 ENCOUNTER — Encounter (INDEPENDENT_AMBULATORY_CARE_PROVIDER_SITE_OTHER): Payer: Self-pay

## 2022-01-28 ENCOUNTER — Other Ambulatory Visit (HOSPITAL_COMMUNITY): Payer: Self-pay

## 2022-02-01 NOTE — Progress Notes (Signed)
TeleHealth Visit:  Due to the COVID-19 pandemic, this visit was completed with telemedicine (audio/video) technology to reduce patient and provider exposure as well as to preserve personal protective equipment.   Brian Cunningham has verbally consented to this TeleHealth visit. The patient is located at home, the provider is located at the [facility] office. The participants in this visit include the listed provider and patient. The visit was conducted today via MyChart video.   Chief Complaint: OBESITY Brian Cunningham is here to discuss his progress with his obesity treatment plan along with follow-up of his obesity related diagnoses. Brian Cunningham is on the Category 4 Plan and states he is following his eating plan approximately 30% of the time. Brian Cunningham states he is walking for 30 minutes 5 times per week.  Today's visit was #: 27 Starting weight: 262 lbs Starting date: 03/12/2019  Interim History: Brian Cunningham has struggled with increased hunger. He recently increased Wegovy to 1.7 mg for 2 doses. He is ready to work on getting back to a structured plan.   Subjective:   1. Pre-diabetes Brian Cunningham has struggled with increased carbohydrates recently but he is ready to work on his eating plan. He requests a refill of metformin. He is doing well on his GLP-1 as well.   2. Other depression with emotional eating Brian Cunningham has had increased stress especially at work and he has done more stress and comfort eating. He has gained some of his weight back.   Assessment/Plan:   1. Pre-diabetes We will refill metformin 500 mg BID #60 for 1 month. Jahki will continue to work on weight loss, exercise, and decreasing simple carbohydrates to help decrease the risk of diabetes.   2. Other depression with emotional eating We will refill Topamax 50 mg q daily #30 for 1 month. Behavior modification techniques were discussed today to help Brian Cunningham deal with his emotional/non-hunger  eating behaviors.  Orders and follow up as documented in patient record.   3. Obesity: Current BMI 30.03 Brian Cunningham is currently in the action stage of change. As such, his goal is to continue with weight loss efforts. He has agreed to change to following a lower carbohydrate, vegetable and lean protein rich diet plan.   Exercise goals: For substantial health benefits, adults should do at least 150 minutes (2 hours and 30 minutes) a week of moderate-intensity, or 75 minutes (1 hour and 15 minutes) a week of vigorous-intensity aerobic physical activity, or an equivalent combination of moderate- and vigorous-intensity aerobic activity. Aerobic activity should be performed in episodes of at least 10 minutes, and preferably, it should be spread throughout the week.  Behavioral modification strategies: increasing lean protein intake and decreasing simple carbohydrates.  Denali has agreed to follow-up with our clinic in 4 weeks. He was informed of the importance of frequent follow-up visits to maximize his success with intensive lifestyle modifications for his multiple health conditions.  Objective:   VITALS: Per patient if applicable, see vitals. GENERAL: Alert and in no acute distress. CARDIOPULMONARY: No increased WOB. Speaking in clear sentences.  PSYCH: Pleasant and cooperative. Speech normal rate and rhythm. Affect is appropriate. Insight and judgement are appropriate. Attention is focused, linear, and appropriate.  NEURO: Oriented as arrived to appointment on time with no prompting.   Lab Results  Component Value Date   CREATININE 0.98 03/11/2021   BUN 20 03/11/2021   NA 141 03/11/2021   K 4.3 03/11/2021   CL 101 03/11/2021   CO2 25 03/11/2021   Lab Results  Component Value Date   ALT 34 03/11/2021   AST 25 03/11/2021   ALKPHOS 66 03/11/2021   BILITOT 0.7 03/11/2021   Lab Results  Component Value Date   HGBA1C 5.1 03/11/2021   HGBA1C 5.1 06/10/2020   HGBA1C 4.8  12/24/2019   HGBA1C 4.8 08/13/2019   HGBA1C 5.2 03/12/2019   Lab Results  Component Value Date   INSULIN 13.8 03/11/2021   INSULIN 9.6 06/10/2020   INSULIN 13.0 12/24/2019   INSULIN 18.1 08/13/2019   INSULIN 31.1 (H) 03/12/2019   Lab Results  Component Value Date   TSH 1.890 03/11/2021   Lab Results  Component Value Date   CHOL 191 03/11/2021   HDL 50 03/11/2021   LDLCALC 122 (H) 03/11/2021   TRIG 106 03/11/2021   Lab Results  Component Value Date   VD25OH 46.4 03/11/2021   VD25OH 107.0 (H) 06/10/2020   VD25OH 97.0 12/24/2019   Lab Results  Component Value Date   WBC 5.4 03/12/2019   HGB 16.9 03/12/2019   HCT 50.1 03/12/2019   MCV 101 (H) 03/12/2019   No results found for: IRON, TIBC, FERRITIN  Attestation Statements:   Reviewed by clinician on day of visit: allergies, medications, problem list, medical history, surgical history, family history, social history, and previous encounter notes.   I, Trixie Dredge, am acting as transcriptionist for Dennard Nip, MD.  I have reviewed the above documentation for accuracy and completeness, and I agree with the above. - Dennard Nip, MD

## 2022-02-03 ENCOUNTER — Other Ambulatory Visit (HOSPITAL_COMMUNITY): Payer: Self-pay

## 2022-02-03 MED ORDER — TOPIRAMATE 50 MG PO TABS
50.0000 mg | ORAL_TABLET | Freq: Every day | ORAL | 0 refills | Status: DC
  Filled 2022-02-03: qty 30, 30d supply, fill #0

## 2022-02-03 NOTE — Telephone Encounter (Signed)
Please advise 

## 2022-02-04 ENCOUNTER — Other Ambulatory Visit (HOSPITAL_COMMUNITY): Payer: Self-pay

## 2022-02-04 MED ORDER — GABAPENTIN 300 MG PO CAPS
600.0000 mg | ORAL_CAPSULE | Freq: Three times a day (TID) | ORAL | 3 refills | Status: AC
Start: 2022-02-04 — End: ?
  Filled 2022-02-04: qty 180, 30d supply, fill #0
  Filled 2022-03-21: qty 180, 30d supply, fill #1
  Filled 2022-06-15: qty 180, 30d supply, fill #2
  Filled 2022-07-16: qty 180, 30d supply, fill #3

## 2022-02-05 ENCOUNTER — Other Ambulatory Visit (HOSPITAL_COMMUNITY): Payer: Self-pay

## 2022-02-07 ENCOUNTER — Other Ambulatory Visit (HOSPITAL_COMMUNITY): Payer: Self-pay

## 2022-02-07 ENCOUNTER — Other Ambulatory Visit (INDEPENDENT_AMBULATORY_CARE_PROVIDER_SITE_OTHER): Payer: Self-pay | Admitting: Family Medicine

## 2022-02-07 DIAGNOSIS — E669 Obesity, unspecified: Secondary | ICD-10-CM

## 2022-02-07 MED ORDER — ATORVASTATIN CALCIUM 40 MG PO TABS
40.0000 mg | ORAL_TABLET | Freq: Every day | ORAL | 3 refills | Status: AC
  Filled 2022-02-07: qty 90, 90d supply, fill #0
  Filled 2022-05-30: qty 90, 90d supply, fill #1
  Filled 2022-08-12: qty 90, 90d supply, fill #2

## 2022-02-07 NOTE — Telephone Encounter (Signed)
Dr.Beasley 

## 2022-02-08 NOTE — Telephone Encounter (Signed)
LAST APPOINTMENT DATE: 01/26/2022 NEXT APPOINTMENT DATE: 02/21/2022   [id number] [address] - [place], [place] - [address] [place] [address] Phone: [phone] Fax: [place] [address] [place] [address] Phone: [phone] Fax: [phone]  Patient is requesting a refill of the following medications: Requested Prescriptions   Pending Prescriptions Disp Refills   Semaglutide-Weight Management (WEGOVY) 1.7 MG/0.75ML SOAJ 3 mL 0    Sig: Inject 1.7 mg into the skin once a week.    Date last filled: 12/27/2021 Previously prescribed by [facility]  Lab Results  Component Value Date   HGBA1C 5.1 03/11/2021   HGBA1C 5.1 06/10/2020   HGBA1C 4.8 12/24/2019   Lab Results  Component Value Date   LDLCALC 122 (H) 03/11/2021   CREATININE 0.98 03/11/2021   Lab Results  Component Value Date   VD25OH 46.4 03/11/2021   VD25OH 107.0 (H) 06/10/2020   VD25OH 97.0 12/24/2019    BP Readings from Last 3 Encounters:  07/08/21 113/76  04/21/21 112/73  03/11/21 134/80

## 2022-02-09 ENCOUNTER — Other Ambulatory Visit (HOSPITAL_COMMUNITY): Payer: Self-pay

## 2022-02-09 MED ORDER — WEGOVY 1.7 MG/0.75ML ~~LOC~~ SOAJ
1.7000 mg | SUBCUTANEOUS | 0 refills | Status: DC
  Filled 2022-02-09: qty 3, 28d supply, fill #0

## 2022-02-14 ENCOUNTER — Other Ambulatory Visit (HOSPITAL_COMMUNITY): Payer: Self-pay

## 2022-02-14 MED ORDER — AMPHETAMINE-DEXTROAMPHET ER 20 MG PO CP24
20.0000 mg | ORAL_CAPSULE | Freq: Every morning | ORAL | 0 refills | Status: AC
  Filled 2022-02-14: qty 30, 30d supply, fill #0

## 2022-02-17 NOTE — Progress Notes (Signed)
TeleHealth Visit:  This visit was completed with telemedicine (audio/video) technology. Brian Cunningham has verbally consented to this TeleHealth visit. The patient is located at home, the provider is located at home. The participants in this visit include the listed provider and patient. The visit was conducted today via MyChart video.  OBESITY [name] is here to discuss his progress with his obesity treatment plan along with follow-up of his obesity related diagnoses.   Today's visit was # 28 Starting weight: 262 lbs Starting date: 03/12/2019 Weight at last in office visit: 234 lbs on 07/08/21 Total weight loss: 28 lbs at last in office visit on 07/08/21. Weight of 245 lbs reported at virtual visit on 12/27/21 Today's reported weight: 245 lbs. He reports he has los 5 lbs.   Nutrition Plan: cat 4 plan - 50% adherence  Current exercise: 60+ minute walk daily.  Interim History: Brian Cunningham was put on the low-carb plan at last visit.  However he has not followed it and has been following the category 4 plan about 50% of the time.  He believes that he does a good job with protein intake. He reports when he deviates from plan it is usually to eat pizza or drink beer. He reports he feels well mentally and physically.  He has recently decided to join his brother in the "[employer] Challenge" for [employer] and hike 28.3 miles in the spring 2024.  He has started training for this with long walks.  He is very excited about this and feels it will be good for him.  He would like to lose down to 225 pounds by then.  He is on Wegovy 1.7 mg and feels appetite is well controlled.  He has some nausea on the day of the injection.  He plans to start taking the injection at night to alleviate this.  He lives in [place] but drives to [place] for work.  He has been doing virtual only visit since November 2022.  He reports he is "not ready" to come back into the office for an appointment. His son attends  visits in tandem with him.  Assessment/Plan:  1. Prediabetes Last A1c was 5.1.  He is tolerating metformin well. Medication(s): Metformin 500 mg twice daily with meals. Lab Results  Component Value Date   HGBA1C 5.1 03/11/2021   Lab Results  Component Value Date   INSULIN 13.8 03/11/2021   INSULIN 9.6 06/10/2020   INSULIN 13.0 12/24/2019   INSULIN 18.1 08/13/2019   INSULIN 31.1 (H) 03/12/2019    Plan: Refill metformin 500 mg twice daily   2. Other depression/emotional eating Isam has had issues with stress/emotional eating. Currently this is well controlled. Overall mood is stable.  He reports that recently his he has been much better mentally and physically. Medication(s): Topiramate 50 mg daily.  Plan: Refill topiramate 50 mg daily.  3. Obesity: Current BMI 30.03 Brian Cunningham is currently in the action stage of change. As such, his goal is to continue with weight loss efforts.  He has agreed to the Category 4 Plan.   Refill Wegovy 1.7 mg weekly.  Exercise goals: as is  Behavioral modification strategies: increasing lean protein intake, decreasing simple carbohydrates, and decreasing alcohol intake.  Dilon has agreed to follow-up with our clinic in 4 weeks.   No orders of the defined types were placed in this encounter.   Medications Discontinued During This Encounter  Medication Reason   Semaglutide-Weight Management (WEGOVY) 1.7 MG/0.75ML SOAJ Reorder   metFORMIN (GLUCOPHAGE) 500 MG  tablet Reorder   topiramate (TOPAMAX) 50 MG tablet Reorder     Meds ordered this encounter  Medications   Semaglutide-Weight Management (WEGOVY) 1.7 MG/0.75ML SOAJ    Sig: Inject 1.7 mg into the skin once a week.    Dispense:  3 mL    Refill:  0    Order Specific Question:   Supervising Provider    Answer:   Quillian Quince D [[id number]]   topiramate (TOPAMAX) 50 MG tablet    Sig: Take 1 tablet (50 mg total) by mouth daily.    Dispense:  30 tablet    Refill:  0     Order Specific Question:   Supervising Provider    Answer:   Quillian Quince D [[id number]]   metFORMIN (GLUCOPHAGE) 500 MG tablet    Sig: Take 1 tablet (500 mg total) by mouth 2 (two) times daily with a meal.    Dispense:  60 tablet    Refill:  0    Order Specific Question:   Supervising Provider    Answer:   Quillian Quince D [[id number]]      Objective:   VITALS: Per patient if applicable, see vitals. GENERAL: Alert and in no acute distress. CARDIOPULMONARY: No increased WOB. Speaking in clear sentences.  PSYCH: Pleasant and cooperative. Speech normal rate and rhythm. Affect is appropriate. Insight and judgement are appropriate. Attention is focused, linear, and appropriate.  NEURO: Oriented as arrived to appointment on time with no prompting.   Lab Results  Component Value Date   CREATININE 0.98 03/11/2021   BUN 20 03/11/2021   NA 141 03/11/2021   K 4.3 03/11/2021   CL 101 03/11/2021   CO2 25 03/11/2021   Lab Results  Component Value Date   ALT 34 03/11/2021   AST 25 03/11/2021   ALKPHOS 66 03/11/2021   BILITOT 0.7 03/11/2021   Lab Results  Component Value Date   HGBA1C 5.1 03/11/2021   HGBA1C 5.1 06/10/2020   HGBA1C 4.8 12/24/2019   HGBA1C 4.8 08/13/2019   HGBA1C 5.2 03/12/2019   Lab Results  Component Value Date   INSULIN 13.8 03/11/2021   INSULIN 9.6 06/10/2020   INSULIN 13.0 12/24/2019   INSULIN 18.1 08/13/2019   INSULIN 31.1 (H) 03/12/2019   Lab Results  Component Value Date   TSH 1.890 03/11/2021   Lab Results  Component Value Date   CHOL 191 03/11/2021   HDL 50 03/11/2021   LDLCALC 122 (H) 03/11/2021   TRIG 106 03/11/2021   Lab Results  Component Value Date   WBC 5.4 03/12/2019   HGB 16.9 03/12/2019   HCT 50.1 03/12/2019   MCV 101 (H) 03/12/2019   No results found for: "IRON", "TIBC", "FERRITIN" Lab Results  Component Value Date   VD25OH 46.4 03/11/2021   VD25OH 107.0 (H) 06/10/2020   VD25OH 97.0 12/24/2019    Attestation Statements:    Reviewed by clinician on day of visit: allergies, medications, problem list, medical history, surgical history, family history, social history, and previous encounter notes.

## 2022-02-18 ENCOUNTER — Other Ambulatory Visit (HOSPITAL_COMMUNITY): Payer: Self-pay

## 2022-02-21 ENCOUNTER — Other Ambulatory Visit (HOSPITAL_COMMUNITY): Payer: Self-pay

## 2022-02-21 ENCOUNTER — Encounter (INDEPENDENT_AMBULATORY_CARE_PROVIDER_SITE_OTHER): Payer: Self-pay | Admitting: Family Medicine

## 2022-02-21 ENCOUNTER — Telehealth (INDEPENDENT_AMBULATORY_CARE_PROVIDER_SITE_OTHER): Payer: 59 | Admitting: Family Medicine

## 2022-02-21 DIAGNOSIS — F902 Attention-deficit hyperactivity disorder, combined type: Secondary | ICD-10-CM | POA: Diagnosis not present

## 2022-02-21 DIAGNOSIS — R7303 Prediabetes: Secondary | ICD-10-CM

## 2022-02-21 DIAGNOSIS — E669 Obesity, unspecified: Secondary | ICD-10-CM

## 2022-02-21 DIAGNOSIS — F319 Bipolar disorder, unspecified: Secondary | ICD-10-CM | POA: Diagnosis not present

## 2022-02-21 DIAGNOSIS — G4700 Insomnia, unspecified: Secondary | ICD-10-CM | POA: Diagnosis not present

## 2022-02-21 DIAGNOSIS — F3289 Other specified depressive episodes: Secondary | ICD-10-CM

## 2022-02-21 DIAGNOSIS — Z6830 Body mass index (BMI) 30.0-30.9, adult: Secondary | ICD-10-CM

## 2022-02-21 MED ORDER — METFORMIN HCL 500 MG PO TABS
500.0000 mg | ORAL_TABLET | Freq: Two times a day (BID) | ORAL | 0 refills | Status: AC
  Filled 2022-02-21: qty 60, 30d supply, fill #0

## 2022-02-21 MED ORDER — LURASIDONE HCL 40 MG PO TABS
40.0000 mg | ORAL_TABLET | Freq: Every day | ORAL | 0 refills | Status: DC
  Filled 2022-02-21 – 2022-03-21 (×2): qty 90, 90d supply, fill #0

## 2022-02-21 MED ORDER — TOPIRAMATE 50 MG PO TABS
50.0000 mg | ORAL_TABLET | Freq: Every day | ORAL | 0 refills | Status: AC
  Filled 2022-02-21 – 2022-03-04 (×2): qty 30, 30d supply, fill #0

## 2022-02-21 MED ORDER — VENLAFAXINE HCL ER 37.5 MG PO CP24
37.5000 mg | ORAL_CAPSULE | Freq: Every day | ORAL | 0 refills | Status: DC
  Filled 2022-02-21: qty 90, 90d supply, fill #0

## 2022-02-21 MED ORDER — CLONAZEPAM 0.5 MG PO TABS
0.5000 mg | ORAL_TABLET | Freq: Every day | ORAL | 0 refills | Status: AC
  Filled 2022-02-21: qty 8, 4d supply, fill #0
  Filled 2022-02-21: qty 52, 26d supply, fill #0

## 2022-02-21 MED ORDER — WEGOVY 1.7 MG/0.75ML ~~LOC~~ SOAJ
1.7000 mg | SUBCUTANEOUS | 0 refills | Status: AC
  Filled 2022-02-21: qty 3, 28d supply, fill #0

## 2022-02-21 MED ORDER — TRAZODONE HCL 100 MG PO TABS
50.0000 mg | ORAL_TABLET | Freq: Every evening | ORAL | 0 refills | Status: AC | PRN
  Filled 2022-02-21: qty 90, 90d supply, fill #0
  Filled 2022-03-21: qty 30, 30d supply, fill #0
  Filled 2022-08-12: qty 30, 30d supply, fill #1

## 2022-02-21 MED ORDER — VENLAFAXINE HCL ER 75 MG PO CP24
75.0000 mg | ORAL_CAPSULE | Freq: Every morning | ORAL | 0 refills | Status: AC
  Filled 2022-02-21: qty 90, 90d supply, fill #0
  Filled 2022-03-21: qty 30, 30d supply, fill #0
  Filled 2022-04-18: qty 30, 30d supply, fill #1

## 2022-02-21 MED ORDER — MIRTAZAPINE 15 MG PO TABS
15.0000 mg | ORAL_TABLET | Freq: Every day | ORAL | 0 refills | Status: AC
  Filled 2022-02-21: qty 90, 90d supply, fill #0

## 2022-02-21 MED ORDER — DIVALPROEX SODIUM ER 500 MG PO TB24
1000.0000 mg | ORAL_TABLET | Freq: Every day | ORAL | 0 refills | Status: AC
  Filled 2022-02-21: qty 180, 90d supply, fill #0
  Filled 2022-03-21: qty 60, 30d supply, fill #0
  Filled 2022-04-18: qty 60, 30d supply, fill #1
  Filled 2022-08-12: qty 60, 30d supply, fill #2

## 2022-02-22 ENCOUNTER — Other Ambulatory Visit (HOSPITAL_COMMUNITY): Payer: Self-pay

## 2022-02-23 DIAGNOSIS — D485 Neoplasm of uncertain behavior of skin: Secondary | ICD-10-CM | POA: Diagnosis not present

## 2022-03-03 ENCOUNTER — Other Ambulatory Visit (HOSPITAL_COMMUNITY): Payer: Self-pay

## 2022-03-04 ENCOUNTER — Other Ambulatory Visit (HOSPITAL_COMMUNITY): Payer: Self-pay

## 2022-03-21 ENCOUNTER — Other Ambulatory Visit (HOSPITAL_COMMUNITY): Payer: Self-pay

## 2022-03-21 ENCOUNTER — Other Ambulatory Visit (INDEPENDENT_AMBULATORY_CARE_PROVIDER_SITE_OTHER): Payer: Self-pay | Admitting: Family Medicine

## 2022-03-21 DIAGNOSIS — R7303 Prediabetes: Secondary | ICD-10-CM

## 2022-03-21 DIAGNOSIS — F3289 Other specified depressive episodes: Secondary | ICD-10-CM

## 2022-03-21 MED ORDER — AMPHETAMINE-DEXTROAMPHET ER 20 MG PO CP24
20.0000 mg | ORAL_CAPSULE | Freq: Every morning | ORAL | 0 refills | Status: AC
  Filled 2022-03-21: qty 30, 30d supply, fill #0

## 2022-03-22 ENCOUNTER — Ambulatory Visit (INDEPENDENT_AMBULATORY_CARE_PROVIDER_SITE_OTHER): Payer: 59 | Admitting: Family Medicine

## 2022-03-22 ENCOUNTER — Other Ambulatory Visit (HOSPITAL_COMMUNITY): Payer: Self-pay

## 2022-03-22 MED ORDER — VENLAFAXINE HCL ER 75 MG PO CP24
75.0000 mg | ORAL_CAPSULE | Freq: Every morning | ORAL | 0 refills | Status: DC
Start: 2022-03-22 — End: 2022-04-26
  Filled 2022-03-22: qty 90, 90d supply, fill #0

## 2022-03-22 MED ORDER — MIRTAZAPINE 15 MG PO TABS
15.0000 mg | ORAL_TABLET | Freq: Every day | ORAL | 0 refills | Status: AC
  Filled 2022-03-22: qty 90, 90d supply, fill #0

## 2022-03-22 MED ORDER — CLONAZEPAM 0.5 MG PO TABS
0.5000 mg | ORAL_TABLET | Freq: Every day | ORAL | 0 refills | Status: DC | PRN
  Filled 2022-03-25: qty 60, 30d supply, fill #0

## 2022-03-22 MED ORDER — DIVALPROEX SODIUM ER 500 MG PO TB24
1000.0000 mg | ORAL_TABLET | Freq: Every day | ORAL | 0 refills | Status: DC
  Filled 2022-03-22: qty 60, 30d supply, fill #0

## 2022-03-22 MED ORDER — TRAZODONE HCL 100 MG PO TABS
50.0000 mg | ORAL_TABLET | Freq: Every evening | ORAL | 0 refills | Status: DC | PRN
  Filled 2022-03-22: qty 90, 90d supply, fill #0

## 2022-03-22 MED ORDER — VENLAFAXINE HCL ER 37.5 MG PO CP24
ORAL_CAPSULE | ORAL | 0 refills | Status: AC
  Filled 2022-03-22: qty 30, 30d supply, fill #0
  Filled 2022-04-18: qty 30, 30d supply, fill #1
  Filled 2022-08-12: qty 30, 30d supply, fill #2

## 2022-03-22 MED ORDER — LURASIDONE HCL 40 MG PO TABS
40.0000 mg | ORAL_TABLET | Freq: Every day | ORAL | 0 refills | Status: AC
  Filled 2022-03-22: qty 30, 30d supply, fill #0

## 2022-03-24 ENCOUNTER — Other Ambulatory Visit (HOSPITAL_COMMUNITY): Payer: Self-pay

## 2022-03-25 ENCOUNTER — Other Ambulatory Visit (HOSPITAL_COMMUNITY): Payer: Self-pay

## 2022-03-30 ENCOUNTER — Other Ambulatory Visit (HOSPITAL_COMMUNITY): Payer: Self-pay

## 2022-03-30 ENCOUNTER — Other Ambulatory Visit (INDEPENDENT_AMBULATORY_CARE_PROVIDER_SITE_OTHER): Payer: Self-pay | Admitting: Family Medicine

## 2022-03-30 DIAGNOSIS — R7303 Prediabetes: Secondary | ICD-10-CM

## 2022-04-06 ENCOUNTER — Encounter (INDEPENDENT_AMBULATORY_CARE_PROVIDER_SITE_OTHER): Payer: Self-pay

## 2022-04-07 ENCOUNTER — Other Ambulatory Visit (HOSPITAL_COMMUNITY): Payer: Self-pay

## 2022-04-14 ENCOUNTER — Other Ambulatory Visit (HOSPITAL_COMMUNITY): Payer: Self-pay

## 2022-04-15 ENCOUNTER — Other Ambulatory Visit (HOSPITAL_COMMUNITY): Payer: Self-pay

## 2022-04-18 ENCOUNTER — Other Ambulatory Visit (HOSPITAL_COMMUNITY): Payer: Self-pay

## 2022-04-20 ENCOUNTER — Other Ambulatory Visit (HOSPITAL_COMMUNITY): Payer: Self-pay

## 2022-04-20 MED ORDER — CLONAZEPAM 0.5 MG PO TABS: 0.5000 mg | ORAL_TABLET | Freq: Every day | ORAL | 0 refills | Status: DC | PRN

## 2022-04-20 MED ORDER — AMPHETAMINE-DEXTROAMPHET ER 20 MG PO CP24
20.0000 mg | ORAL_CAPSULE | Freq: Every morning | ORAL | 0 refills | Status: DC
  Filled 2022-04-20: qty 30, 30d supply, fill #0

## 2022-04-21 ENCOUNTER — Other Ambulatory Visit (HOSPITAL_COMMUNITY): Payer: Self-pay

## 2022-04-26 ENCOUNTER — Other Ambulatory Visit (HOSPITAL_COMMUNITY): Payer: Self-pay

## 2022-04-26 MED ORDER — LURASIDONE HCL 40 MG PO TABS
ORAL_TABLET | ORAL | 0 refills | Status: AC
Start: 2022-04-26 — End: ?
  Filled 2022-04-26: qty 90, 90d supply, fill #0

## 2022-04-26 MED ORDER — VENLAFAXINE HCL ER 75 MG PO CP24
75.0000 mg | ORAL_CAPSULE | Freq: Every morning | ORAL | 0 refills | Status: AC
  Filled 2022-04-26: qty 90, 90d supply, fill #0

## 2022-04-26 MED ORDER — MIRTAZAPINE 15 MG PO TABS
ORAL_TABLET | ORAL | 0 refills | Status: AC
  Filled 2022-04-26: qty 90, 90d supply, fill #0

## 2022-04-27 ENCOUNTER — Other Ambulatory Visit (HOSPITAL_COMMUNITY): Payer: Self-pay

## 2022-04-28 ENCOUNTER — Other Ambulatory Visit (HOSPITAL_COMMUNITY): Payer: Self-pay

## 2022-04-28 MED ORDER — CLONAZEPAM 0.5 MG PO TABS
0.5000 mg | ORAL_TABLET | Freq: Every day | ORAL | 0 refills | Status: DC | PRN
  Filled 2022-04-28: qty 60, 30d supply, fill #0

## 2022-04-29 ENCOUNTER — Other Ambulatory Visit (HOSPITAL_COMMUNITY): Payer: Self-pay

## 2022-04-29 MED ORDER — CLONAZEPAM 0.5 MG PO TABS
ORAL_TABLET | ORAL | 0 refills | Status: AC
  Filled 2022-04-29: qty 60, 30d supply, fill #0

## 2022-05-06 ENCOUNTER — Other Ambulatory Visit (HOSPITAL_COMMUNITY): Payer: Self-pay

## 2022-05-06 MED ORDER — MIRTAZAPINE 15 MG PO TABS
15.0000 mg | ORAL_TABLET | Freq: Every evening | ORAL | 0 refills | Status: AC
  Filled 2022-05-06 – 2022-08-12 (×2): qty 90, 90d supply, fill #0

## 2022-05-06 MED ORDER — LURASIDONE HCL 40 MG PO TABS
40.0000 mg | ORAL_TABLET | Freq: Every day | ORAL | 0 refills | Status: AC
  Filled 2022-05-06: qty 90, 90d supply, fill #0

## 2022-05-06 MED ORDER — AMPHETAMINE-DEXTROAMPHET ER 20 MG PO CP24
20.0000 mg | ORAL_CAPSULE | Freq: Every morning | ORAL | 0 refills | Status: AC
  Filled 2022-06-24: qty 30, 30d supply, fill #0

## 2022-05-06 MED ORDER — VENLAFAXINE HCL ER 37.5 MG PO CP24
37.5000 mg | ORAL_CAPSULE | Freq: Every day | ORAL | 0 refills | Status: AC
  Filled 2022-05-06: qty 90, 90d supply, fill #0

## 2022-05-06 MED ORDER — TRAZODONE HCL 100 MG PO TABS
50.0000 mg | ORAL_TABLET | Freq: Every evening | ORAL | 0 refills | Status: AC
  Filled 2022-05-06: qty 90, 90d supply, fill #0

## 2022-05-06 MED ORDER — VENLAFAXINE HCL ER 75 MG PO CP24
75.0000 mg | ORAL_CAPSULE | Freq: Every morning | ORAL | 0 refills | Status: AC
  Filled 2022-05-06 – 2022-08-12 (×2): qty 90, 90d supply, fill #0

## 2022-05-06 MED ORDER — DIVALPROEX SODIUM ER 500 MG PO TB24
1000.0000 mg | ORAL_TABLET | Freq: Every evening | ORAL | 0 refills | Status: AC
  Filled 2022-05-06: qty 180, 90d supply, fill #0

## 2022-05-10 ENCOUNTER — Other Ambulatory Visit (HOSPITAL_COMMUNITY): Payer: Self-pay

## 2022-05-11 ENCOUNTER — Other Ambulatory Visit (HOSPITAL_COMMUNITY): Payer: Self-pay

## 2022-05-30 ENCOUNTER — Other Ambulatory Visit (HOSPITAL_COMMUNITY): Payer: Self-pay

## 2022-05-30 MED ORDER — AMPHETAMINE-DEXTROAMPHET ER 20 MG PO CP24
20.0000 mg | ORAL_CAPSULE | Freq: Every morning | ORAL | 0 refills | Status: AC
  Filled 2022-07-22: qty 30, 30d supply, fill #0

## 2022-05-30 MED ORDER — CLONAZEPAM 0.5 MG PO TABS
0.5000 mg | ORAL_TABLET | Freq: Every day | ORAL | 0 refills | Status: AC | PRN
  Filled 2022-05-30: qty 60, 30d supply, fill #0

## 2022-05-31 ENCOUNTER — Other Ambulatory Visit (HOSPITAL_COMMUNITY): Payer: Self-pay

## 2022-05-31 ENCOUNTER — Other Ambulatory Visit: Payer: Self-pay

## 2022-06-03 ENCOUNTER — Other Ambulatory Visit (HOSPITAL_COMMUNITY): Payer: Self-pay

## 2022-06-09 ENCOUNTER — Other Ambulatory Visit (HOSPITAL_COMMUNITY): Payer: Self-pay

## 2022-06-15 ENCOUNTER — Other Ambulatory Visit (HOSPITAL_COMMUNITY): Payer: Self-pay

## 2022-06-24 ENCOUNTER — Other Ambulatory Visit (HOSPITAL_COMMUNITY): Payer: Self-pay

## 2022-07-07 ENCOUNTER — Other Ambulatory Visit (HOSPITAL_COMMUNITY): Payer: Self-pay

## 2022-07-08 ENCOUNTER — Other Ambulatory Visit (HOSPITAL_COMMUNITY): Payer: Self-pay

## 2022-07-08 MED ORDER — CLONAZEPAM 0.5 MG PO TABS
0.5000 mg | ORAL_TABLET | Freq: Every day | ORAL | 0 refills | Status: DC | PRN
  Filled 2022-07-08: qty 60, 30d supply, fill #0

## 2022-07-16 ENCOUNTER — Other Ambulatory Visit (HOSPITAL_COMMUNITY): Payer: Self-pay

## 2022-07-18 ENCOUNTER — Other Ambulatory Visit (HOSPITAL_COMMUNITY): Payer: Self-pay

## 2022-07-22 ENCOUNTER — Encounter (HOSPITAL_COMMUNITY): Payer: Self-pay | Admitting: Pharmacist

## 2022-07-22 ENCOUNTER — Other Ambulatory Visit (HOSPITAL_COMMUNITY): Payer: Self-pay

## 2022-08-04 ENCOUNTER — Other Ambulatory Visit (HOSPITAL_COMMUNITY): Payer: Self-pay

## 2022-08-12 ENCOUNTER — Other Ambulatory Visit (HOSPITAL_COMMUNITY): Payer: Self-pay

## 2022-08-12 MED ORDER — OMEPRAZOLE 40 MG PO CPDR
40.0000 mg | DELAYED_RELEASE_CAPSULE | Freq: Every day | ORAL | 3 refills | Status: AC
  Filled 2022-08-12: qty 90, 90d supply, fill #0

## 2022-08-12 MED ORDER — LEVOTHYROXINE SODIUM 88 MCG PO TABS
88.0000 ug | ORAL_TABLET | Freq: Every morning | ORAL | 1 refills | Status: AC
  Filled 2022-08-12 – 2022-08-13 (×2): qty 90, 90d supply, fill #0

## 2022-08-13 ENCOUNTER — Other Ambulatory Visit (HOSPITAL_COMMUNITY): Payer: Self-pay

## 2022-08-15 ENCOUNTER — Other Ambulatory Visit: Payer: Self-pay

## 2022-08-31 IMAGING — MR MR HEAD WO/W CM
14 series · 48 of 48 positions shown · IV contrast (gadavist)
Comparison: None.

CLINICAL DATA: Left ear pain with tingling for 1 year.

EXAM:
MRI HEAD WITHOUT AND WITH CONTRAST
TECHNIQUE: Multiplanar, multiecho pulse sequences of the brain and surrounding
structures were obtained without and with intravenous contrast.
CONTRAST:  10mL GADAVIST GADOBUTROL 1 MMOL/ML IV SOLN

[Series 5: ax dwi_tracew · axial · 3.0mm · 0.65mm/px · z∈[-58,+97]mm · 3 of 48 slices shown]
[im 1/48]
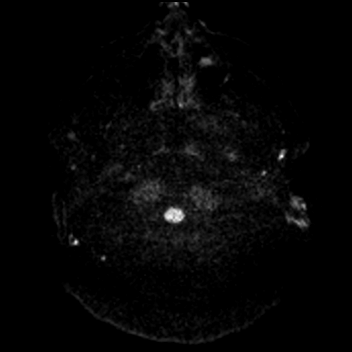
[im 24/48]
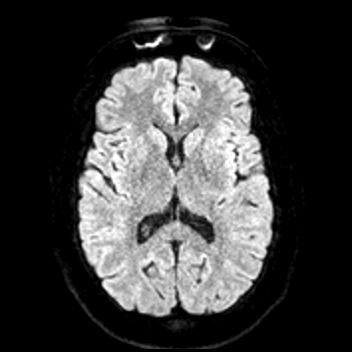
[im 48/48]
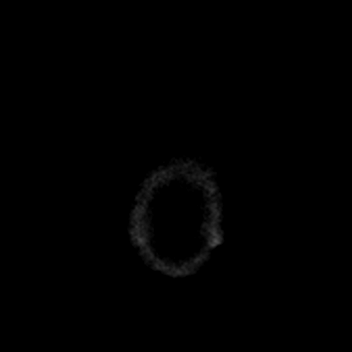

[Series 6: ax dwi_adc · axial · 3.0mm · 0.65mm/px · z∈[-58,+97]mm · 3 of 48 slices shown]
[im 1/48]
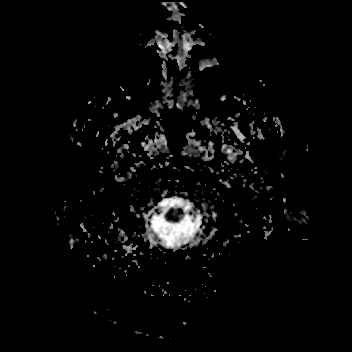
[im 24/48]
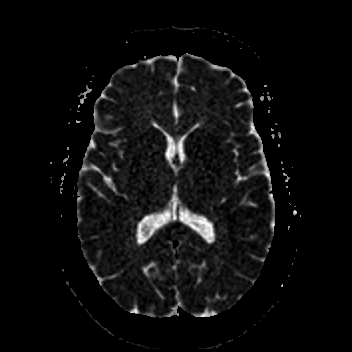
[im 48/48]
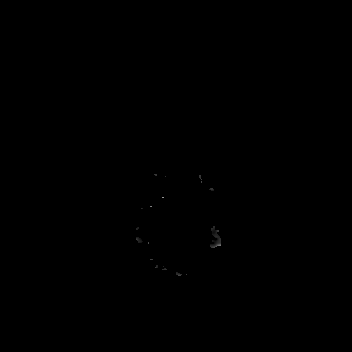

[Series 7: cor dwi_tracew · coronal · 5.0mm · 0.65mm/px · 2 of 40 slices shown]
[im 1/40]
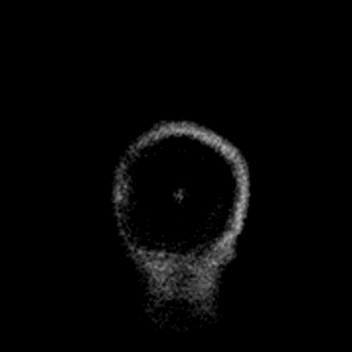
[im 40/40]
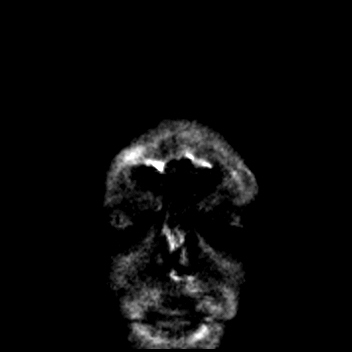

[Series 8: cor dwi_adc · coronal · 5.0mm · 0.65mm/px · 2 of 40 slices shown]
[im 1/40]
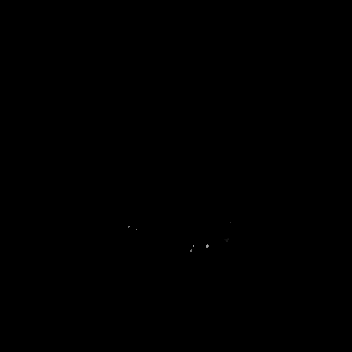
[im 40/40]
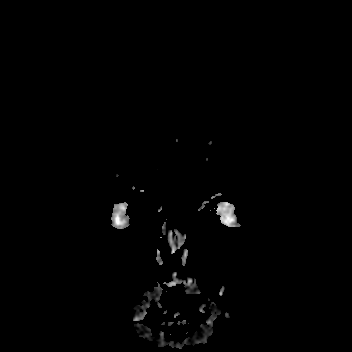

[Series 9: T1 · sagittal · 5.0mm · 0.62mm/px · 1 of 23 slices shown (1 of 2)]
[im 1/23]
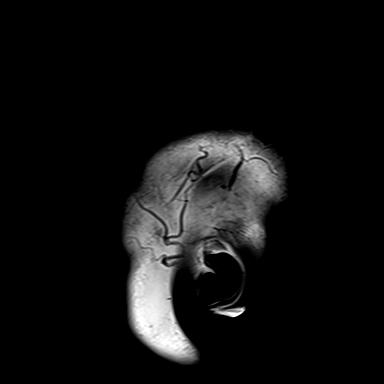

[Series 10: T2 · axial · 5.0mm · 0.53mm/px · 1 of 25 slices shown]
[im 1/25]
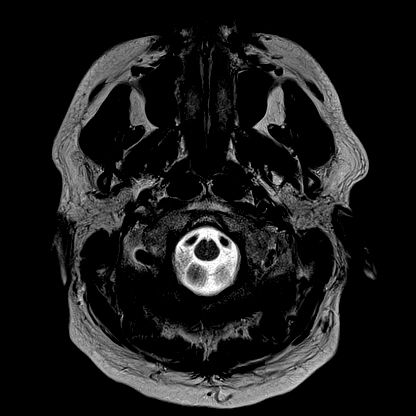

[Series 11: mag_images · axial · 3.0mm · 0.90mm/px · z∈[-72,+104]mm · 3 of 60 slices shown]
[im 1/60]
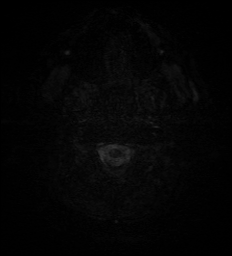
[im 30/60]
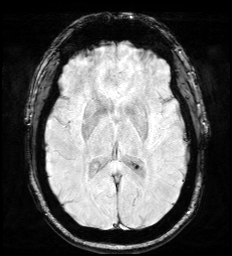
[im 60/60]
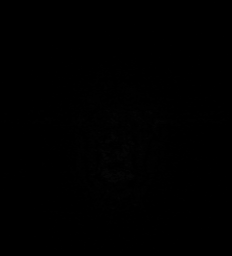

[Series 12: pha_images · axial · 3.0mm · 0.90mm/px · z∈[-72,+104]mm · 3 of 60 slices shown]
[im 1/60]
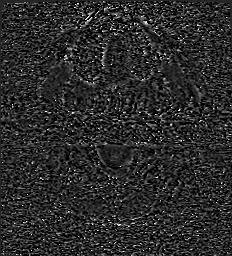
[im 30/60]
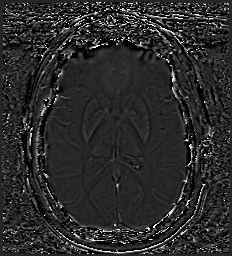
[im 60/60]
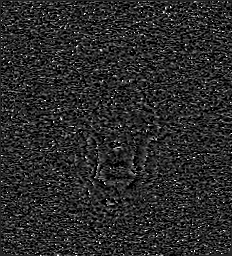

[Series 13: swi_images · axial · 3.0mm · 0.90mm/px · z∈[-72,+104]mm · 3 of 60 slices shown]
[im 1/60]
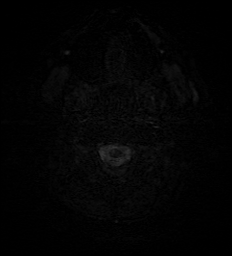
[im 30/60]
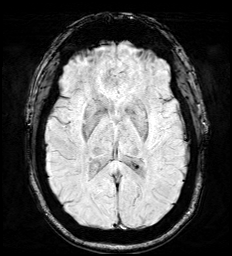
[im 60/60]
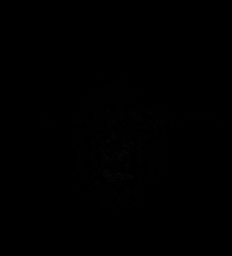

[Series 15: FLAIR · axial · 3.0mm · 0.53mm/px · z∈[-65,+96]mm · 3 of 55 slices shown]
[im 1/55]
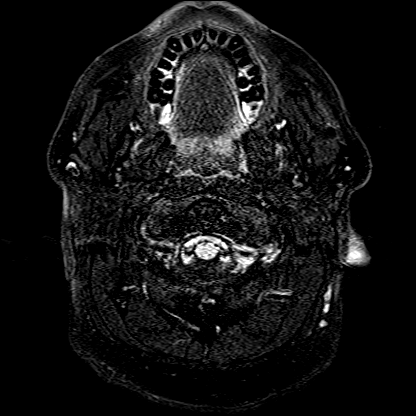
[im 28/55]
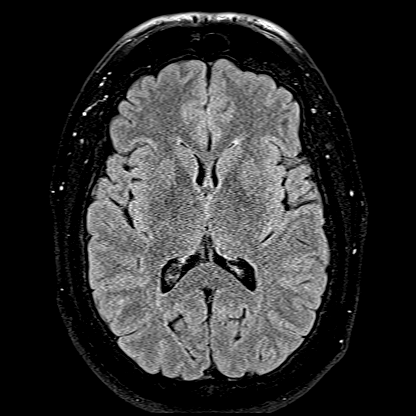
[im 55/55]
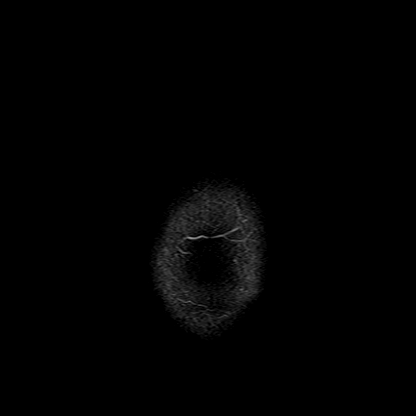

[Series 16: T1 · axial · 1.0mm · 0.98mm/px · z∈[-70,+104]mm · 10 of 176 slices shown (2 of 2)]
[im 1/176]
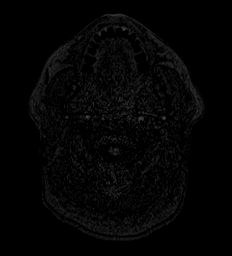
[im 20/176]
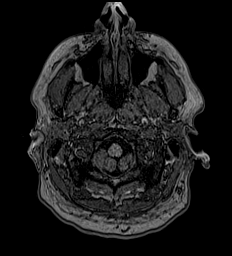
[im 39/176]
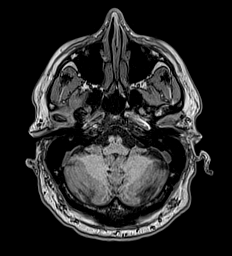
[im 59/176]
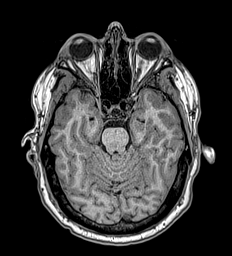
[im 78/176]
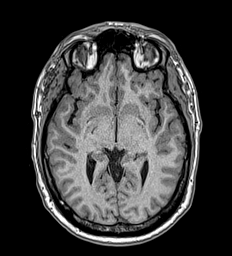
[im 98/176]
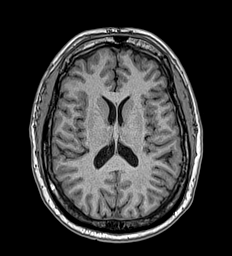
[im 117/176]
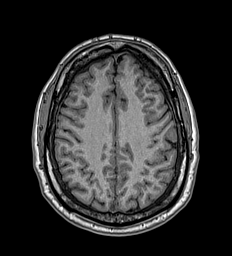
[im 137/176]
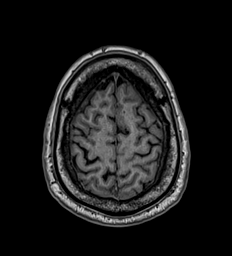
[im 156/176]
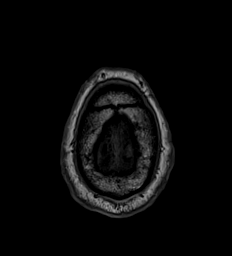
[im 176/176]
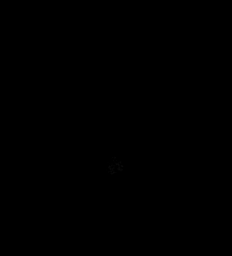

[Series 17: T2 post-contrast · coronal · 5.0mm · 0.57mm/px · 2 of 29 slices shown]
[im 1/29]
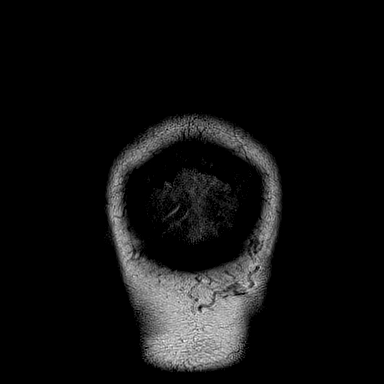
[im 29/29]
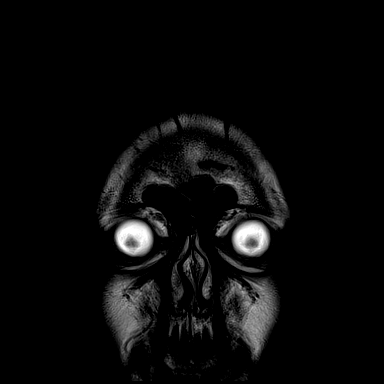

[Series 18: T1 post-contrast · axial · 1.0mm · 0.98mm/px · z∈[-70,+104]mm · 10 of 176 slices shown (1 of 2)]
[im 1/176]
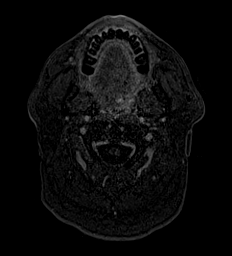
[im 20/176]
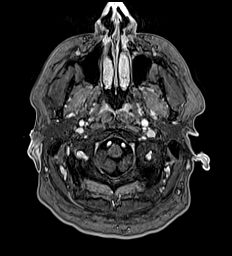
[im 39/176]
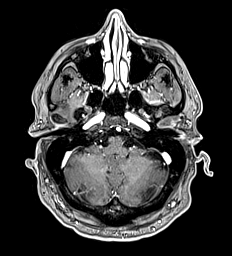
[im 59/176]
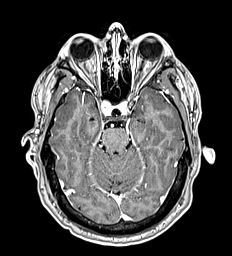
[im 78/176]
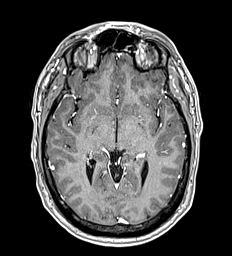
[im 98/176]
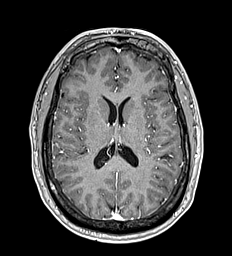
[im 117/176]
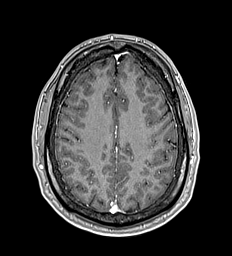
[im 137/176]
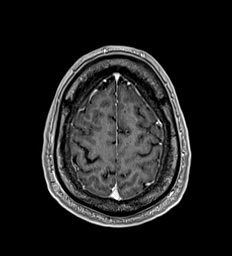
[im 156/176]
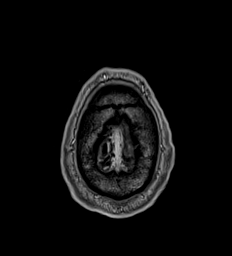
[im 176/176]
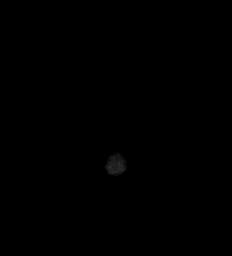

[Series 19: T1 post-contrast · coronal · 5.0mm · 0.57mm/px · 2 of 29 slices shown (2 of 2)]
[im 1/29]
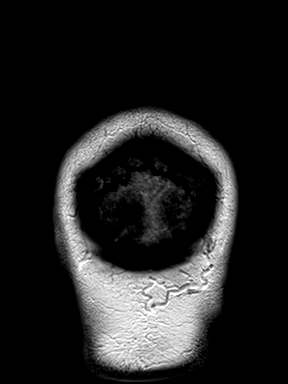
[im 29/29]
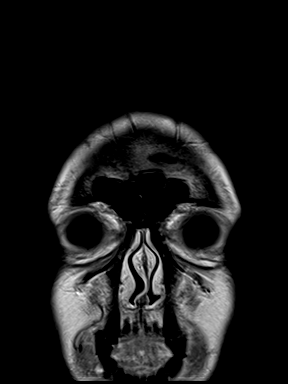

[48 of 48 positions shown; findings below may reference images not displayed]

FINDINGS: Brain: There is no acute infarction or intracranial hemorrhage.
There is no intracranial mass, mass effect, or edema. There is no
hydrocephalus or extra-axial fluid collection. Ventricles and sulci
are normal in size and configuration. No abnormal enhancement.

Vascular: Major vessel flow voids at the skull base are preserved.

Skull and upper cervical spine: Normal marrow signal is preserved.

Sinuses/Orbits: Paranasal sinuses are aerated. Orbits are
unremarkable.

Other: Sella is unremarkable.  Mastoid air cells are clear.
IMPRESSION: No acute or significant abnormality.

## 2022-09-07 ENCOUNTER — Other Ambulatory Visit (HOSPITAL_COMMUNITY): Payer: Self-pay

## 2022-09-09 ENCOUNTER — Other Ambulatory Visit (HOSPITAL_COMMUNITY): Payer: Self-pay

## 2022-09-09 MED ORDER — CLONAZEPAM 0.5 MG PO TABS
0.5000 mg | ORAL_TABLET | Freq: Every day | ORAL | 0 refills | Status: AC | PRN
  Filled 2022-09-09: qty 60, 30d supply, fill #0

## 2022-09-09 MED ORDER — AMPHETAMINE-DEXTROAMPHET ER 20 MG PO CP24
20.0000 mg | ORAL_CAPSULE | Freq: Every day | ORAL | 0 refills | Status: AC
  Filled 2022-09-09: qty 30, 30d supply, fill #0

## 2022-09-12 ENCOUNTER — Other Ambulatory Visit (HOSPITAL_COMMUNITY): Payer: Self-pay

## 2022-09-14 ENCOUNTER — Other Ambulatory Visit (HOSPITAL_COMMUNITY): Payer: Self-pay

## 2022-09-14 MED ORDER — VIBERZI 100 MG PO TABS
100.0000 mg | ORAL_TABLET | Freq: Two times a day (BID) | ORAL | 0 refills | Status: AC
  Filled 2022-09-14: qty 180, 90d supply, fill #0

## 2022-10-18 ENCOUNTER — Other Ambulatory Visit (HOSPITAL_COMMUNITY): Payer: Self-pay
